# Patient Record
Sex: Female | Born: 1945 | Race: White | Hispanic: No | Marital: Single | Smoking: Former smoker
Health system: Southern US, Community
[De-identification: ages and names within clinical notes are randomized; demographics above are authoritative.]

## PROBLEM LIST (undated history)

## (undated) DIAGNOSIS — C801 Malignant (primary) neoplasm, unspecified: Secondary | ICD-10-CM

## (undated) DIAGNOSIS — E785 Hyperlipidemia, unspecified: Secondary | ICD-10-CM

## (undated) DIAGNOSIS — I10 Essential (primary) hypertension: Secondary | ICD-10-CM

## (undated) DIAGNOSIS — C3411 Malignant neoplasm of upper lobe, right bronchus or lung: Secondary | ICD-10-CM

## (undated) DIAGNOSIS — K219 Gastro-esophageal reflux disease without esophagitis: Secondary | ICD-10-CM

## (undated) DIAGNOSIS — R911 Solitary pulmonary nodule: Secondary | ICD-10-CM

## (undated) DIAGNOSIS — I77810 Thoracic aortic ectasia: Secondary | ICD-10-CM

## (undated) HISTORY — DX: Thoracic aortic ectasia: I77.810

## (undated) HISTORY — DX: Hyperlipidemia, unspecified: E78.5

## (undated) HISTORY — DX: Malignant neoplasm of upper lobe, right bronchus or lung: C34.11

## (undated) HISTORY — PX: EYE SURGERY: SHX253

## (undated) HISTORY — DX: Solitary pulmonary nodule: R91.1

## (undated) HISTORY — DX: Essential (primary) hypertension: I10

## (undated) HISTORY — DX: Malignant (primary) neoplasm, unspecified: C80.1

## (undated) HISTORY — PX: WRIST SURGERY: SHX841

## (undated) HISTORY — DX: Gastro-esophageal reflux disease without esophagitis: K21.9

---
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
## 1991-07-14 HISTORY — PX: VAGINAL HYSTERECTOMY: SUR661

## 2009-02-13 ENCOUNTER — Inpatient Hospital Stay (HOSPITAL_COMMUNITY): Admission: EM | Admit: 2009-02-13 | Discharge: 2009-02-20 | Payer: Self-pay | Admitting: Emergency Medicine

## 2009-12-19 ENCOUNTER — Emergency Department (HOSPITAL_COMMUNITY): Admission: EM | Admit: 2009-12-19 | Discharge: 2009-12-19 | Payer: Self-pay | Admitting: Emergency Medicine

## 2010-02-18 ENCOUNTER — Encounter: Admission: RE | Admit: 2010-02-18 | Discharge: 2010-02-18 | Payer: Self-pay | Admitting: Family Medicine

## 2010-10-18 LAB — COMPREHENSIVE METABOLIC PANEL
AST: 22 U/L (ref 0–37)
Albumin: 3.6 g/dL (ref 3.5–5.2)
Alkaline Phosphatase: 68 U/L (ref 39–117)
BUN: 9 mg/dL (ref 6–23)
CO2: 29 mEq/L (ref 19–32)
Calcium: 8.9 mg/dL (ref 8.4–10.5)
Creatinine, Ser: 0.87 mg/dL (ref 0.4–1.2)
GFR calc Af Amer: >60 mL/min (ref >60)
Glucose, Bld: 95 mg/dL (ref 70–99)
Potassium: 3.5 mEq/L (ref 3.5–5.1)

## 2010-10-18 LAB — CBC
HCT: 38.7 % (ref 36.0–46.0)
Hemoglobin: 13.4 g/dL (ref 12.0–15.0)
MCHC: 34.5 g/dL (ref 30.0–36.0)
MCV: 89 fL (ref 78.0–100.0)
Platelets: 216 10*3/uL (ref 150–400)
Platelets: 244 10*3/uL (ref 150–400)
RBC: 4.08 MIL/uL (ref 3.87–5.11)
RBC: 4.36 MIL/uL (ref 3.87–5.11)
RDW: 13.6 % (ref 11.5–15.5)
WBC: 10 10*3/uL (ref 4.0–10.5)
WBC: 3.3 10*3/uL — ABNORMAL LOW (ref 4.0–10.5)

## 2010-10-18 LAB — URINALYSIS, ROUTINE W REFLEX MICROSCOPIC
Hgb urine dipstick: NEGATIVE
Protein, ur: NEGATIVE mg/dL
Specific Gravity, Urine: 1.013 (ref 1.005–1.030)
pH: 6.5 (ref 5.0–8.0)

## 2010-10-18 LAB — BASIC METABOLIC PANEL
BUN: 4 mg/dL — ABNORMAL LOW (ref 6–23)
CO2: 27 mEq/L (ref 19–32)
Calcium: 8.6 mg/dL (ref 8.4–10.5)
Calcium: 9.4 mg/dL (ref 8.4–10.5)
Chloride: 95 mEq/L — ABNORMAL LOW (ref 96–112)
Creatinine, Ser: 0.74 mg/dL (ref 0.4–1.2)
Creatinine, Ser: 0.87 mg/dL (ref 0.4–1.2)
GFR calc Af Amer: >60 mL/min (ref >60)
Glucose, Bld: 144 mg/dL — ABNORMAL HIGH (ref 70–99)
Potassium: 4.1 mEq/L (ref 3.5–5.1)
Potassium: 4.1 mEq/L (ref 3.5–5.1)
Sodium: 132 mEq/L — ABNORMAL LOW (ref 135–145)
Sodium: 134 mEq/L — ABNORMAL LOW (ref 135–145)
Sodium: 135 mEq/L (ref 135–145)

## 2010-10-18 LAB — PHOSPHORUS: Phosphorus: 3.3 mg/dL (ref 2.3–4.6)

## 2010-10-18 LAB — URINE CULTURE: Culture: NO GROWTH

## 2010-10-18 LAB — MAGNESIUM: Magnesium: 1.8 mg/dL (ref 1.5–2.5)

## 2010-10-18 LAB — DIFFERENTIAL
Basophils Absolute: 0.1 10*3/uL (ref 0.0–0.1)
Lymphs Abs: 1.2 10*3/uL (ref 0.7–4.0)
Neutro Abs: 7.9 10*3/uL — ABNORMAL HIGH (ref 1.7–7.7)

## 2010-11-25 NOTE — H&P (Signed)
Mcneil Mcneil.:  [id number]   MEDICAL RECORD Mcneil.:  [id number]          PATIENT TYPE:  INP   LOCATION:  5118                         FACILITY:  [facility]   PHYSICIAN:  Lennie Muckle, MD      DATE OF BIRTH:  29-Oct-1945   DATE OF ADMISSION:  02/13/2009  DATE OF DISCHARGE:                              HISTORY & PHYSICAL   CHIEF COMPLAINT:  Abdominal pain.   HISTORY OF PRESENT ILLNESS:  The patient is a 65 year old female who  comes in complaining of abdominal pain.  The patient reports it started  approximately 2 days ago with gradual onset progressively worse pain  last evening with associated nausea and vomiting.  The patient reports  her belly gradually distended, and she was Mcneil longer able to pass gas or  have a bowel movement.  She comes in from the emergency department  seeking medical care.  She reports some chills and myalgias, but Mcneil true  fever.  She denies any chest pain, shortness of breath.  She denies  changes in her urination.  Mcneil dysuria, hematuria, nocturia.  She denies  any changes in mental status.  Denies any skin lesions or rashes.   PAST MEDICAL HISTORY:  Significant for hyperlipidemia.   PAST SURGICAL HISTORY:  Significant for hysterectomy and open  appendectomy in 1963.   FAMILY HISTORY:  Not pertinent in this case.   SOCIAL HISTORY:  The patient denies any tobacco or illicit drug use,  occasional alcoholic beverage.   DRUG ALLERGIES:  Mcneil known drug or latex allergies.   MEDICATIONS:  Consist of Pravachol.   PHYSICAL EXAMINATION:  VITAL SIGNS:  Stable.  Temperature 97.9, pulse is  78, respiratory rate 18, and blood pressure 122/82.  GENERAL APPEARANCE:  A 65 year old white female in Mcneil acute distress,  well-nourished, well-developed.  ENT:  Unremarkable, Mcneil lymphadenopathy.  NECK:  Supple.  CHEST:  Clear to auscultation.  Mcneil wheezes, rhonchi, or rales.  HEART:  Regular rate and rhythm.  Mcneil murmurs, gallops, or rubs.  ABDOMEN:  Moderately distended and protuberant.  She is kind of  diffusely tender predominately in the lower abdomen with high-pitched  bowel sounds audible.  EXTREMITIES:  Within normal limits with regards to full range of motion.  Deep tendon reflexes and 2+ peripheral pulses throughout.  SKIN:  Warm and dry with good turgor.  Mcneil rashes or skin lesions are  noted.  NEUROLOGIC:  The patient is alert and oriented, has a grossly intact  cranial nerves II through XII.   DIAGNOSTIC STUDIES:  A plain film abdomen and subsequent CT of the  abdomen shows significant changes of air-fluid levels with a possible  transition zone in the lower abdomen consistent with small bowel  obstruction, likely secondary to adhesions.   LABORATORY DATA:  The patient's white blood cell count is borderline  elevated at 10.0.  Otherwise, her electrolytes and renal function are  within normal limits and a urinalysis was otherwise negative.   ASSESSMENT OR ADMISSION DIAGNOSES:  1. Small bowel obstruction likely secondary to adhesions.  2. Hyperlipidemia.  PLAN:  Plan is to admit the patient for surgical intervention.  We  discussed the procedure of diagnostic laparoscopy possible exploratory  laparotomy with lysis of adhesions.  Again, procedure was discussed at  length including the risks, complications, postoperative expectations  with the patient and her son who are in agreement and will consent.  We  will give the patient a gram of Mefoxin on-call to the operating room  prior to procedure.      Brayton El, PA-C      Lennie Muckle, MD  Electronically Signed    KB/MEDQ  D:  02/13/2009  T:  02/13/2009  Job:  [id number]

## 2010-11-25 NOTE — Discharge Summary (Signed)
NAMEJOANELL, Tiffany Mcneil NO.:  [id number]   MEDICAL RECORD NO.:  [id number]          PATIENT TYPE:  INP   LOCATION:  5118                         FACILITY:  [facility]   PHYSICIAN:  Lennie Muckle, MD      DATE OF BIRTH:  [birth year]   DATE OF ADMISSION:  02/13/2009  DATE OF DISCHARGE:  02/20/2009                               DISCHARGE SUMMARY   DISCHARGING PHYSICIAN:  Wilmon Arms. Corliss Skains, MD   ADMITTING PHYSICIAN:  Amber L. Freida Busman, MD   HISTORY OF PRESENT ILLNESS:  The patient is a pleasant 65 year old  female who came to the emergency department complaining of abdominal  pain that progressed from several days prior to admission.  She also  developed associated nausea and vomiting and stated her belly became  more distended.  She subsequently was seen in the emergency department  without having any recent bowel movements or flatus.  She was seen in  the emergency department and worked up and subsequent CT found evidence  of a small bowel obstruction likely secondary to adhesions.  The  decision was made at that time to admit the patient for IV fluid  hydration and probable surgical intervention.   SUMMARY OF THE HOSPITAL COURSE:  The patient was admitted on February 13, 2009, was taken to the operating room that same day by Dr. Freida Busman and  underwent diagnostic laparoscopy with lysis of adhesions and release of  small bowel obstruction.  The patient suffered no immediate  complications during the procedure and was admitted to the floor in a  stable condition.  Postoperatively; however, the patient had a  significant postoperative ileus that has lasted numerous days.  Her labs  have been normal with most recent white blood cell count of 3.3 and  electrolytes being normal as well.  The patient had shown signs of  progression with small amounts of flatus being passed.  However,  initiation of liquid diet resulted in recurrent nausea and vomiting.  The patient was subsequently  started on Reglan IV q.6 h. on February 19, 2009.  Today, on August 11, the patient feels significantly better, has  not had any further nausea or vomiting since the previous day and has  continued to pass flatus and bowel movement.  She is tolerating full  liquid and soft diet and feels appropriate to be discharged home.  No  other complications or pertinent information is available at this time.  The patient is stable for discharge.   DISCHARGE DIAGNOSES:  1. Small bowel obstruction likely secondary to lysis of adhesions.  2. Postop ileus - resolving.  3. Hyperlipidemia.   DISCHARGE PLAN:  The patient will be discharge with instructions  regarding diet restrictions and activity restrictions.  The patient is  advised to continue going slowly with a bland, soft, or liquid diet  until consistent bowel activity is established, then a normal diet can  be resumed.  She will be given prescription for Reglan 10 mg to be taken  3 times daily for the next 5 days as an outpatient to continue with  her  resolving ileus, but if she feels that she no longer needs this over the  next 24-48 hours, she is okay to discontinue it.  She will also be given  prescription for  narcotic analgesic as to be used as needed, no more than 4 tablets  today.  We will have her follow up in the office to see Dr. Freida Busman in  approximately 1-2 weeks as an outpatient.  She is instructed to call our  office any sooner if issue should arise.      Brayton El, PA-C      Lennie Muckle, MD  Electronically Signed    KB/MEDQ  D:  02/20/2009  T:  02/21/2009  Job:  [id number]

## 2010-11-25 NOTE — Op Note (Signed)
NAMESAMEERA, BETTON NO.:  [id number]   MEDICAL RECORD NO.:  [id number]          PATIENT TYPE:  INP   LOCATION:  5118                         FACILITY:  [facility]   PHYSICIAN:  Lennie Muckle, MD      DATE OF BIRTH:  1945/10/13   DATE OF PROCEDURE:  02/13/2009  DATE OF DISCHARGE:                               OPERATIVE REPORT   PREOPERATIVE DIAGNOSIS:  Small bowel obstruction likely from adhesions.   POSTOPERATIVE DIAGNOSIS:  Small bowel obstruction likely from adhesions.   PROCEDURE:  Diagnostic laparoscopy with lysis of adhesions.   FINDINGS:  Adhesive band in the left lower quadrant causing obstruction  in small intestine.   SURGEON:  Amber L. Freida Busman, MD   ASSISTANT:  OR staff.   ANESTHESIA:  General endotracheal anesthesia.   COMPLICATIONS:  No immediate complications.   DRAINS:  No drains were placed.   ESTIMATED BLOOD LOSS:  Minimal amount of blood loss.   INDICATIONS FOR PROCEDURE:  Ms. Stanko is a 65 year old female who began  having abdominal pain in the couple of days ago.  The pain was crescendo-  decrescendo.  The pain intensified last night.  She began to having  nausea and vomiting.  Passed no flatus for 2 days.  She came in the  emergency department, had a CT scan, which revealed an obstruction with  a likely adhesion in the lower abdomen.  She had also had a large amount  of free fluid within her abdomen.  Examination was concerning due to  abdominal pain.  I talked to Ms. Mcpartland preoperatively about performing  diagnostic laparoscopy.  I did discuss possibility of an open procedure  and a bowel resection if indeed this was required.  All questions were  answered.  Informed consent was obtained.  She had received cefoxitin  preoperatively.   DETAILS OF PROCEDURE:  Ms. Khamis was identified in the preoperative  holding area.  She was then taken to the operating room and placed in  supine position.  After administration of general  endotracheal  anesthesia, SCDs were applied to the lower extremity.  A Foley catheter  was placed.  Her abdomen was prepped and draped in the usual sterile  fashion.  A time-out procedure indicating the patient and procedure were  performed.  I placed an incision just below the umbilicus through the  old incisional scar.  Identified the fascia.  This was incised with a  #15 blade.  I then identified the peritoneum, incised with Metzenbaum  scissors, easily gained entry into the abdominal cavity.  Finger  palpation confirmed entry into abdominal cavity.  There were no evidence  of adhesions to the abdominal wall.  I placed a Hasson trocar into the  abdominal cavity.  I insufflated the abdomen.  I inspected the abdomen  and found no evidence of injury upon placement of trocar and no  adhesions to the abdominal wall.  I placed 5-mm trocar in the right  upper quadrant under visualization of the camera.  She did have a large  amount of fluid within the abdomen.  I then placed a 5-mm trocar in the  left lower quadrant.  I began by identifying the cecum.  The ileum was  identified.  There were few adhesion of scars in this area, which I was  able to gently do with blunt dissection as well as the laparoscopic  scissors.  I continued inferiorly down to the pelvis.  There were few  more adhesions, which are easily dissected with the scissors.  There was  a loop which was adhered down in the pelvis.  I began mobilizing the  intestine and found this indeed adhered to the abdominal wall on the  left.  There was a dense adhesion of scar, which was successfully  divided with the laparoscopic scissors.  I then was able to freely  remove the intestine from the cecum to the ligament of Treitz.  This  appeared to be the one area of concern on the left lower quadrant.  I  irrigated the abdomen with a liter of saline, found no other evidence of  adhesions and no injury to the intestine, then closed the fascial  defect  at the umbilical area using a 0 Vicryl suture.  Skin was removed after  releasing pneumoperitoneum, skin was closed with 4-0 Monocryl.  Dermabond was placed for final dressing.  The patient was then extubated  and transported to the postanesthesia care unit in stable condition.  She will be monitored for the next couple of days when she was able to  tolerate a diet and pain is controlled.      Lennie Muckle, MD  Electronically Signed     ALA/MEDQ  D:  02/13/2009  T:  02/13/2009  Job:  [id number]   cc:   [facility]

## 2011-08-11 ENCOUNTER — Other Ambulatory Visit: Payer: Self-pay | Admitting: Interventional Cardiology

## 2011-08-12 ENCOUNTER — Inpatient Hospital Stay (HOSPITAL_BASED_OUTPATIENT_CLINIC_OR_DEPARTMENT_OTHER)
Admission: RE | Admit: 2011-08-12 | Discharge: 2011-08-12 | Disposition: A | Discharge status: Home / Self Care | Payer: Medicare Other | Source: Ambulatory Visit | Attending: Interventional Cardiology | Admitting: Interventional Cardiology

## 2011-08-12 ENCOUNTER — Encounter (HOSPITAL_BASED_OUTPATIENT_CLINIC_OR_DEPARTMENT_OTHER)
Admission: RE | Disposition: A | Discharge status: Home / Self Care | Payer: Self-pay | Source: Ambulatory Visit | Attending: Interventional Cardiology

## 2011-08-12 DIAGNOSIS — I77819 Aortic ectasia, unspecified site: Secondary | ICD-10-CM | POA: Insufficient documentation

## 2011-08-12 SURGERY — JV LEFT HEART CATHETERIZATION WITH CORONARY ANGIOGRAM
Anesthesia: Moderate Sedation

## 2011-08-12 MED ORDER — DIAZEPAM 5 MG PO TABS
5.0000 mg | ORAL_TABLET | ORAL | Status: AC
  Administered 2011-08-12: 5 mg via ORAL

## 2011-08-12 MED ORDER — MORPHINE SULFATE 2 MG/ML IJ SOLN: 1.0000 mg | INTRAMUSCULAR | Status: DC | PRN

## 2011-08-12 MED ORDER — ONDANSETRON HCL 4 MG/2ML IJ SOLN: 4.0000 mg | Freq: Four times a day (QID) | INTRAMUSCULAR | Status: DC | PRN

## 2011-08-12 MED ORDER — SODIUM CHLORIDE 0.9 % IV SOLN: INTRAVENOUS | Status: AC

## 2011-08-12 MED ORDER — ASPIRIN 81 MG PO CHEW: 81.0000 mg | CHEWABLE_TABLET | Freq: Every day | ORAL | Status: DC

## 2011-08-12 MED ORDER — ACETAMINOPHEN 325 MG PO TABS: 650.0000 mg | ORAL_TABLET | ORAL | Status: DC | PRN

## 2011-08-12 MED ORDER — SODIUM CHLORIDE 0.9 % IV SOLN: 250.0000 mL | INTRAVENOUS | Status: DC | PRN

## 2011-08-12 MED ORDER — SODIUM CHLORIDE 0.9 % IJ SOLN: 3.0000 mL | Freq: Two times a day (BID) | INTRAMUSCULAR | Status: DC

## 2011-08-12 MED ORDER — SODIUM CHLORIDE 0.9 % IJ SOLN: 3.0000 mL | INTRAMUSCULAR | Status: DC | PRN

## 2011-08-12 MED ORDER — SODIUM CHLORIDE 0.9 % IV SOLN: INTRAVENOUS | Status: DC

## 2011-08-12 MED ORDER — ASPIRIN 81 MG PO CHEW
324.0000 mg | CHEWABLE_TABLET | ORAL | Status: AC
  Administered 2011-08-12: 324 mg via ORAL

## 2011-08-12 NOTE — OR Nursing (Signed)
Tegaderm dressing applied, site level 0, bedrest begins at 1205 

## 2011-08-12 NOTE — H&P (Signed)
  Date of Initial H&P: 08/07/2011  History reviewed, patient examined, no change in status, stable for surgery.   Tiffany Mcneil S. 08/12/2011

## 2011-08-12 NOTE — OR Nursing (Signed)
Dr Varanasi at bedside to discuss results and treatment plan with pt and family 

## 2011-08-12 NOTE — OR Nursing (Signed)
Discharge instructions reviewed and signed, pt stated understanding, ambulated in hall without difficulty, site level 0, transported to son's car via wheelchair 

## 2011-08-12 NOTE — Op Note (Signed)
PROCEDURE:  Left heart catheterization with selective coronary angiography, left ventriculogram.  INDICATIONS:    The risks, benefits, and details of the procedure were explained to the patient.  The patient verbalized understanding and wanted to proceed.  Informed written consent was obtained.  PROCEDURE TECHNIQUE:  After Xylocaine anesthesia a 64F sheath was placed in the right femoral artery with a single anterior needle wall stick.   Left coronary angiography was done using a Judkins L6 guide catheter.  Right coronary angiography was done using an AL2 guide catheter.  Left ventriculography was done using a pigtail catheter.    CONTRAST:  Total of 110 cc.  COMPLICATIONS:  None.    HEMODYNAMICS:  Aortic pressure was 154/84; LV pressure was 153 [place] and; LVEDP 18.  There was no gradient between the left ventricle and aorta.    ANGIOGRAPHIC DATA:   The left main coronary artery is widely patent.  The left anterior descending artery is a large vessel which reaches the apex.  It appears angiographically normal..  The left circumflex artery is a large codominant vessel.  There is a ramus intermedius vessel which appears patent.  This is a small vessel.  There is a small OM1 which is widely patent.  There is a large OM 2 which is widely patent.  There is a small left PDA which is patent..  The right coronary artery is is a large codominant vessel.  The PDA and posterior lateral artery are small but widely patent.  The right coronary system is widely patent.  LEFT VENTRICULOGRAM:  Left ventricular angiogram was done in the 30 RAO projection and revealed normal left ventricular wall motion and systolic function with an estimated ejection fraction of 60 %.  LVEDP was 18 mmHg.  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  This required use of special catheters to engage the coronaries.  IMPRESSIONS:  1. Normal left main coronary artery. 2. Normal left anterior descending artery and its  branches. 3. Normal left circumflex artery and its branches. 4. Normal right coronary artery. 5. Normal left ventricular systolic function.  LVEDP 18 mmHg.  Ejection fraction 60 %. 6.  Dilated aortic root and ascending aorta.  RECOMMENDATION:  Continue preventative therapy.  She will need further imaging of her ascending aorta.  Will schedule echocardiogram.

## 2011-08-12 NOTE — OR Nursing (Signed)
Meal served 

## 2011-08-14 ENCOUNTER — Other Ambulatory Visit: Payer: Self-pay | Admitting: Interventional Cardiology

## 2011-08-14 DIAGNOSIS — R0789 Other chest pain: Secondary | ICD-10-CM

## 2011-09-29 ENCOUNTER — Other Ambulatory Visit: Payer: Self-pay | Admitting: Family Medicine

## 2011-09-29 ENCOUNTER — Ambulatory Visit
Admission: RE | Admit: 2011-09-29 | Discharge: 2011-09-29 | Disposition: A | Discharge status: Home / Self Care | Payer: Medicare Other | Source: Ambulatory Visit | Attending: Family Medicine | Admitting: Family Medicine

## 2011-09-29 ENCOUNTER — Other Ambulatory Visit (HOSPITAL_COMMUNITY): Payer: Self-pay | Admitting: Family Medicine

## 2011-09-29 DIAGNOSIS — R911 Solitary pulmonary nodule: Secondary | ICD-10-CM

## 2011-09-29 DIAGNOSIS — R9389 Abnormal findings on diagnostic imaging of other specified body structures: Secondary | ICD-10-CM

## 2011-09-29 MED ORDER — IOHEXOL 300 MG/ML  SOLN
75.0000 mL | Freq: Once | INTRAMUSCULAR | Status: AC | PRN
  Administered 2011-09-29: 75 mL via INTRAVENOUS

## 2011-10-07 ENCOUNTER — Inpatient Hospital Stay (HOSPITAL_COMMUNITY): Admission: RE | Admit: 2011-10-07 | Payer: Medicare Other | Source: Ambulatory Visit

## 2011-10-08 DIAGNOSIS — D179 Benign lipomatous neoplasm, unspecified: Secondary | ICD-10-CM | POA: Insufficient documentation

## 2011-10-08 DIAGNOSIS — M810 Age-related osteoporosis without current pathological fracture: Secondary | ICD-10-CM | POA: Insufficient documentation

## 2011-10-08 DIAGNOSIS — M7050 Other bursitis of knee, unspecified knee: Secondary | ICD-10-CM | POA: Insufficient documentation

## 2011-10-08 DIAGNOSIS — I10 Essential (primary) hypertension: Secondary | ICD-10-CM | POA: Insufficient documentation

## 2011-10-08 DIAGNOSIS — C3411 Malignant neoplasm of upper lobe, right bronchus or lung: Secondary | ICD-10-CM | POA: Insufficient documentation

## 2011-10-08 DIAGNOSIS — K219 Gastro-esophageal reflux disease without esophagitis: Secondary | ICD-10-CM | POA: Insufficient documentation

## 2011-10-08 DIAGNOSIS — J309 Allergic rhinitis, unspecified: Secondary | ICD-10-CM | POA: Insufficient documentation

## 2011-10-08 DIAGNOSIS — E785 Hyperlipidemia, unspecified: Secondary | ICD-10-CM | POA: Insufficient documentation

## 2011-10-09 ENCOUNTER — Encounter (HOSPITAL_COMMUNITY)
Admission: RE | Admit: 2011-10-09 | Discharge: 2011-10-09 | Disposition: A | Discharge status: Home / Self Care | Payer: Medicare Other | Source: Ambulatory Visit | Attending: Family Medicine | Admitting: Family Medicine

## 2011-10-09 ENCOUNTER — Encounter (HOSPITAL_COMMUNITY): Payer: Self-pay

## 2011-10-09 DIAGNOSIS — R911 Solitary pulmonary nodule: Secondary | ICD-10-CM | POA: Insufficient documentation

## 2011-10-09 LAB — GLUCOSE, CAPILLARY: Glucose-Capillary: 125 mg/dL — ABNORMAL HIGH (ref 70–99)

## 2011-10-09 MED ORDER — FLUDEOXYGLUCOSE F - 18 (FDG) INJECTION
15.5000 | Freq: Once | INTRAVENOUS | Status: AC | PRN
  Administered 2011-10-09: 15.5 via INTRAVENOUS

## 2011-10-14 ENCOUNTER — Telehealth: Payer: Self-pay | Admitting: *Deleted

## 2011-10-14 NOTE — Telephone Encounter (Signed)
Left message regarding appt time and place 

## 2011-10-15 ENCOUNTER — Institutional Professional Consult (permissible substitution) (INDEPENDENT_AMBULATORY_CARE_PROVIDER_SITE_OTHER): Payer: Medicare Other | Admitting: Thoracic Surgery

## 2011-10-15 ENCOUNTER — Other Ambulatory Visit: Payer: Self-pay | Admitting: Thoracic Surgery

## 2011-10-15 ENCOUNTER — Other Ambulatory Visit: Payer: Self-pay

## 2011-10-15 VITALS — BP 143/87 | HR 91 | Temp 97.4°F | Resp 18 | Ht 68.0 in | Wt 178.0 lb

## 2011-10-15 DIAGNOSIS — R222 Localized swelling, mass and lump, trunk: Secondary | ICD-10-CM

## 2011-10-15 DIAGNOSIS — R918 Other nonspecific abnormal finding of lung field: Secondary | ICD-10-CM

## 2011-10-15 DIAGNOSIS — D381 Neoplasm of uncertain behavior of trachea, bronchus and lung: Secondary | ICD-10-CM

## 2011-10-15 NOTE — Progress Notes (Signed)
HPI this 66 year old Tiffany Mcneil is a former smoker. She had a chest x-ray in 2011 and had a recent chest x-ray that shows a new right upper lobe 12 mm lesion this is confirmed on CT scan. PET scan showed a standard uptake value of 1.3. Her previous surgery includes appendectomy hysterectomy and low back surgery. She smoked 25 years and quit smoking 20 years ago. He is a moderate use of alcohol he also gives a history of colonic polyps as well as hypertension and hypercholesterolemia. She's had no hemoptysis fever chills or excessive sputum. Since this is a new nodule in the right upper lobe resection was recommended. Tiffany Mcneil agrees to the surgery and understands the risk and benefits.   Current Outpatient Prescriptions  Medication Sig Dispense Refill  . alendronate (FOSAMAX) 70 MG tablet Take 70 mg by mouth every 7 (seven) days. Take with a full glass of water on an empty stomach.      Marland Kitchen amLODipine (NORVASC) 5 MG tablet Take 5 mg by mouth daily.      Marland Kitchen atorvastatin (LIPITOR) 20 MG tablet Take 20 mg by mouth daily.      . clobetasol cream (TEMOVATE) 0.05 % Apply topically 2 (two) times daily.      Marland Kitchen esomeprazole (NEXIUM) 40 MG capsule Take 40 mg by mouth daily before breakfast.      . fish oil-omega-3 fatty acids 1000 MG capsule Take 2 g by mouth daily.      . hydrochlorothiazide (MICROZIDE) 12.5 MG capsule Take 12.5 mg by mouth daily.      Marland Kitchen HYDROcodone-homatropine (HYCODAN) 5-1.5 MG/5ML syrup Take 5 mLs by mouth every 6 (six) hours as needed.      . mometasone (NASONEX) 50 MCG/ACT nasal spray Place 2 sprays into the nose daily.      . Multiple Vitamins-Minerals (MULTIVITAL) tablet Take 1 tablet by mouth daily.      . benzonatate (TESSALON) 200 MG capsule Take 200 mg by mouth 3 (three) times daily as needed.      . nitroGLYCERIN (NITROSTAT) 0.4 MG SL tablet Place 0.4 mg under the tongue every 5 (five) minutes as needed.         Review of Systems: Her weight side and 70 pounds she is 5 foot 8 inch  hemorrhoids been stable. Cardiac no recent angina or atrial fibrillation. Pulmonary she has a cough that was thought to be secondary to lisinopril which is been stopped. GI she has reflux from previous appendectomy GU no kidney disease this year or frequent urination. Vascular no claudication DVT or TIAs. Neurology no dizziness headaches like as seizures. Musculoskeletal joint pain psychiatric no depression or nervousness. ENT no change in her Ieyesight or her hearing   Physical Exam  Constitutional: She is oriented to person, place, and time. She appears well-developed and well-nourished.  HENT:  Head: Atraumatic.  Right Ear: External ear normal.  Left Ear: External ear normal.  Mouth/Throat: Oropharynx is clear and moist.  Eyes: Conjunctivae and EOM are normal. Pupils are equal, round, and reactive to light.  Neck: Normal range of motion. Neck supple.  Cardiovascular: Normal rate and normal heart sounds.   Pulmonary/Chest: Effort normal and breath sounds normal. No respiratory distress.  Abdominal: Soft. Bowel sounds are normal.  Musculoskeletal: Normal range of motion. She exhibits no edema and no tenderness.  Lymphadenopathy:    She has no cervical adenopathy.  Neurological: She is alert and oriented to person, place, and time. She has normal reflexes. No cranial nerve deficit.  Coordination normal.  Skin: Skin is warm and dry.  Psychiatric: She has a normal mood and affect. Her behavior is normal. Judgment and thought content normal.     Diagnostic Tests: CT scan shows a 12 mm nodule in the right upper lobe with an SUV on PET 1.3 there also is a 10 x 7 mm nodule in the left upper lobe   Impression: Bilateral pulmonary nodules rule out adenocarcinoma in situ   Plan: Resection right upper lobe nodule

## 2011-10-16 ENCOUNTER — Encounter (HOSPITAL_COMMUNITY)
Admission: RE | Admit: 2011-10-16 | Discharge: 2011-10-16 | Disposition: A | Discharge status: Home / Self Care | Payer: Medicare Other | Source: Ambulatory Visit | Attending: Thoracic Surgery | Admitting: Thoracic Surgery

## 2011-10-16 ENCOUNTER — Other Ambulatory Visit: Payer: Self-pay

## 2011-10-16 ENCOUNTER — Inpatient Hospital Stay (HOSPITAL_COMMUNITY)
Admission: RE | Admit: 2011-10-16 | Discharge: 2011-10-16 | Disposition: A | Discharge status: Home / Self Care | Payer: Medicare Other | Source: Ambulatory Visit | Attending: Thoracic Surgery | Admitting: Thoracic Surgery

## 2011-10-16 ENCOUNTER — Encounter (HOSPITAL_COMMUNITY): Payer: Self-pay

## 2011-10-16 ENCOUNTER — Encounter: Payer: Self-pay | Admitting: *Deleted

## 2011-10-16 VITALS — BP 126/79 | HR 82 | Temp 96.6°F | Resp 20 | Ht 68.0 in | Wt 175.9 lb

## 2011-10-16 DIAGNOSIS — D381 Neoplasm of uncertain behavior of trachea, bronchus and lung: Secondary | ICD-10-CM

## 2011-10-16 LAB — URINALYSIS, ROUTINE W REFLEX MICROSCOPIC
Bilirubin Urine: NEGATIVE
Hgb urine dipstick: NEGATIVE
Ketones, ur: NEGATIVE mg/dL
Protein, ur: NEGATIVE mg/dL
Urobilinogen, UA: 0.2 mg/dL (ref 0.0–1.0)

## 2011-10-16 LAB — CBC
HCT: 38.7 % (ref 36.0–46.0)
MCHC: 34.6 g/dL (ref 30.0–36.0)
Platelets: 282 10*3/uL (ref 150–400)
RDW: 13.6 % (ref 11.5–15.5)

## 2011-10-16 LAB — BLOOD GAS, ARTERIAL
Drawn by: 344381
FIO2: 0.21 %
Patient temperature: 98.6
pH, Arterial: 7.527 — ABNORMAL HIGH (ref 7.350–7.400)

## 2011-10-16 LAB — SURGICAL PCR SCREEN
MRSA, PCR: NEGATIVE
Staphylococcus aureus: NEGATIVE

## 2011-10-16 LAB — COMPREHENSIVE METABOLIC PANEL
Albumin: 3.9 g/dL (ref 3.5–5.2)
Alkaline Phosphatase: 78 U/L (ref 39–117)
BUN: 14 mg/dL (ref 6–23)
Potassium: 3 mEq/L — ABNORMAL LOW (ref 3.5–5.1)
Sodium: 134 mEq/L — ABNORMAL LOW (ref 135–145)
Total Protein: 6.9 g/dL (ref 6.0–8.3)

## 2011-10-16 LAB — APTT: aPTT: 28 seconds (ref 24–37)

## 2011-10-16 LAB — ABO/RH: ABO/RH(D): A NEG

## 2011-10-16 LAB — PROTIME-INR: Prothrombin Time: 12.5 seconds (ref 11.6–15.2)

## 2011-10-16 LAB — PULMONARY FUNCTION TEST

## 2011-10-16 NOTE — Pre-Procedure Instructions (Signed)
20 Tiffany Mcneil  10/16/2011   Your procedure is scheduled on:  April 10,2013  Report to [facility] at 5:30 AM.  Call this number if you have problems the morning of surgery: [phone]   Remember:   Do not eat food:After Midnight.  May have clear liquids: up to 4 Hours before arrival.  Clear liquids include soda, tea, black coffee, apple or grape juice, broth.  Take these medicines the morning of surgery with A SIP OF WATER: NORVASC,NEXIUM,HYCODAN AS NEEDED   Do not wear jewelry, make-up or nail polish.  Do not wear lotions, powders, or perfumes. You may wear deodorant.  Do not shave 48 hours prior to surgery.  Do not bring valuables to the hospital.  Contacts, dentures or bridgework may not be worn into surgery.  Leave suitcase in the car. After surgery it may be brought to your room.  For patients admitted to the hospital, checkout time is 11:00 AM the day of discharge.   Patients discharged the day of surgery will not be allowed to drive home.  Name and phone number of your driver:   Special Instructions: CHG Shower Use Special Wash: 1/2 bottle night before surgery and 1/2 bottle morning of surgery.   Please read over the following fact sheets that you were given: Pain Booklet, Blood Transfusion Information, MRSA Information and Surgical Site Infection Prevention

## 2011-10-16 NOTE — Progress Notes (Signed)
Spoke with pt at [facility] 10/15/11.  Educational/resource information given to pt.  Questions and concerns answered.

## 2011-10-19 ENCOUNTER — Encounter (HOSPITAL_COMMUNITY): Payer: Medicare Other

## 2011-10-19 ENCOUNTER — Encounter (HOSPITAL_COMMUNITY): Payer: Self-pay | Admitting: Vascular Surgery

## 2011-10-19 ENCOUNTER — Other Ambulatory Visit (HOSPITAL_COMMUNITY): Payer: Medicare Other

## 2011-10-19 ENCOUNTER — Encounter (HOSPITAL_COMMUNITY): Payer: Self-pay | Admitting: Pharmacy Technician

## 2011-10-19 NOTE — Consult Note (Signed)
Anesthesia: Patient is a 66 year old scheduled for a right VATS, possible RU lobectomy on 10/22/11.  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  Her EKG from 10/16/11 showed NSR, nonspecific ST/T wave abnormality.    She was referred to Dr. Eldridge Dace for Cardiology evaluation for chest pain on 08/07/11.  It appears she began having chest pain during her treadmill stress test, so a decision was made to proceed with cardiac catheterization which was done on 08/12/11 and showed:  1. Normal left main coronary artery. 2. Normal left anterior descending artery and its branches. 3. Normal left circumflex artery and its branches. 4. Normal right coronary artery. 5. Normal left ventricular systolic function. LVEDP 18 mmHg. Ejection fraction 60 %.       6. Dilated aortic root and ascending aorta.    An echo was subsequently done which showed LVEF 60-65%, mild LAE, trace AR, moderate ascending aorta dilation up to 4.3 cm.  MRA of the ascending aorta in 6 months was recommended.  Chest CT with contrast on 09/29/11 showed: 1. The nodular area questioned on chest x-ray represents an 9 x 12 x 14 mm irregularly marginated noncalcified lesion in the right upper lobe. Neoplasm cannot be excluded and PET CT is recommended.  2. No mediastinal or hilar adenopathy.  Labs noted.  She is for a CXR on the day of surgery.  Anticipate she can proceed as planned.

## 2011-10-20 ENCOUNTER — Other Ambulatory Visit (HOSPITAL_COMMUNITY): Payer: Medicare Other

## 2011-10-21 ENCOUNTER — Encounter: Payer: Medicare Other | Admitting: Cardiothoracic Surgery

## 2011-10-21 MED ORDER — DEXTROSE 5 % IV SOLN
1.5000 g | INTRAVENOUS | Status: AC
  Administered 2011-10-22: 1.5 g via INTRAVENOUS
  Filled 2011-10-21: qty 1.5

## 2011-10-22 ENCOUNTER — Encounter (HOSPITAL_COMMUNITY): Payer: Self-pay | Admitting: Vascular Surgery

## 2011-10-22 ENCOUNTER — Inpatient Hospital Stay (HOSPITAL_COMMUNITY)
Admission: RE | Admit: 2011-10-22 | Discharge: 2011-10-26 | DRG: 165 | Disposition: A | Discharge status: Home / Self Care | Payer: Medicare Other | Source: Ambulatory Visit | Attending: Thoracic Surgery | Admitting: Thoracic Surgery

## 2011-10-22 ENCOUNTER — Ambulatory Visit (HOSPITAL_COMMUNITY): Payer: Medicare Other

## 2011-10-22 ENCOUNTER — Ambulatory Visit (HOSPITAL_COMMUNITY): Payer: Medicare Other | Admitting: Vascular Surgery

## 2011-10-22 ENCOUNTER — Encounter (HOSPITAL_COMMUNITY)
Admission: RE | Disposition: A | Discharge status: Home / Self Care | Payer: Self-pay | Source: Ambulatory Visit | Attending: Thoracic Surgery

## 2011-10-22 ENCOUNTER — Encounter (HOSPITAL_COMMUNITY): Payer: Self-pay | Admitting: *Deleted

## 2011-10-22 DIAGNOSIS — M810 Age-related osteoporosis without current pathological fracture: Secondary | ICD-10-CM | POA: Diagnosis present

## 2011-10-22 DIAGNOSIS — K219 Gastro-esophageal reflux disease without esophagitis: Secondary | ICD-10-CM | POA: Diagnosis present

## 2011-10-22 DIAGNOSIS — D381 Neoplasm of uncertain behavior of trachea, bronchus and lung: Secondary | ICD-10-CM

## 2011-10-22 DIAGNOSIS — C3410 Malignant neoplasm of upper lobe, unspecified bronchus or lung: Principal | ICD-10-CM | POA: Diagnosis present

## 2011-10-22 DIAGNOSIS — Z87891 Personal history of nicotine dependence: Secondary | ICD-10-CM

## 2011-10-22 DIAGNOSIS — I10 Essential (primary) hypertension: Secondary | ICD-10-CM | POA: Diagnosis present

## 2011-10-22 DIAGNOSIS — Z01812 Encounter for preprocedural laboratory examination: Secondary | ICD-10-CM

## 2011-10-22 DIAGNOSIS — Z01810 Encounter for preprocedural cardiovascular examination: Secondary | ICD-10-CM

## 2011-10-22 DIAGNOSIS — Z01818 Encounter for other preprocedural examination: Secondary | ICD-10-CM

## 2011-10-22 HISTORY — PX: LUNG SURGERY: SHX703

## 2011-10-22 SURGERY — VIDEO ASSISTED THORACOSCOPY (VATS)/ LOBECTOMY
Anesthesia: General | Site: Chest | Laterality: Right | Wound class: Clean Contaminated

## 2011-10-22 MED ORDER — ONDANSETRON HCL 4 MG/2ML IJ SOLN
4.0000 mg | Freq: Four times a day (QID) | INTRAMUSCULAR | Status: DC | PRN
  Administered 2011-10-22 – 2011-10-25 (×2): 4 mg via INTRAVENOUS
  Filled 2011-10-22 (×2): qty 2

## 2011-10-22 MED ORDER — FENTANYL 10 MCG/ML IV SOLN
INTRAVENOUS | Status: DC
  Administered 2011-10-22: 12:00:00 via INTRAVENOUS
  Administered 2011-10-22: 10 ug via INTRAVENOUS
  Administered 2011-10-23: 60 ug via INTRAVENOUS
  Administered 2011-10-23 (×2): 110 ug via INTRAVENOUS
  Administered 2011-10-23: 20 ug via INTRAVENOUS
  Administered 2011-10-23: 40 ug via INTRAVENOUS
  Administered 2011-10-23: 60 ug via INTRAVENOUS
  Administered 2011-10-24: 03:00:00 via INTRAVENOUS
  Administered 2011-10-24: 10 ug via INTRAVENOUS
  Administered 2011-10-24: 70 ug via INTRAVENOUS
  Administered 2011-10-24: 20 ug via INTRAVENOUS
  Filled 2011-10-22 (×2): qty 50

## 2011-10-22 MED ORDER — ALBUTEROL SULFATE HFA 108 (90 BASE) MCG/ACT IN AERS
4.0000 | INHALATION_SPRAY | Freq: Four times a day (QID) | RESPIRATORY_TRACT | Status: AC
  Administered 2011-10-22 – 2011-10-24 (×6): 4 via RESPIRATORY_TRACT
  Filled 2011-10-22: qty 6.7

## 2011-10-22 MED ORDER — OMEGA-3-ACID ETHYL ESTERS 1 G PO CAPS
1.0000 g | ORAL_CAPSULE | Freq: Every day | ORAL | Status: DC
  Administered 2011-10-22: 1 g via ORAL
  Filled 2011-10-22 (×5): qty 1

## 2011-10-22 MED ORDER — MIDAZOLAM HCL 5 MG/5ML IJ SOLN
INTRAMUSCULAR | Status: DC | PRN
  Administered 2011-10-22 (×2): 1 mg via INTRAVENOUS

## 2011-10-22 MED ORDER — ACETAMINOPHEN 10 MG/ML IV SOLN
INTRAVENOUS | Status: AC
  Filled 2011-10-22: qty 100

## 2011-10-22 MED ORDER — NALOXONE HCL 0.4 MG/ML IJ SOLN: 0.4000 mg | INTRAMUSCULAR | Status: DC | PRN

## 2011-10-22 MED ORDER — NEOSTIGMINE METHYLSULFATE 1 MG/ML IJ SOLN
INTRAMUSCULAR | Status: DC | PRN
  Administered 2011-10-22: 4 mg via INTRAVENOUS

## 2011-10-22 MED ORDER — ACETAMINOPHEN 10 MG/ML IV SOLN
INTRAVENOUS | Status: DC | PRN
  Administered 2011-10-22: 1000 mg via INTRAVENOUS

## 2011-10-22 MED ORDER — SODIUM CHLORIDE 0.9 % IJ SOLN: 9.0000 mL | INTRAMUSCULAR | Status: DC | PRN

## 2011-10-22 MED ORDER — OMEGA-3 FATTY ACIDS 1000 MG PO CAPS: 1.0000 g | ORAL_CAPSULE | Freq: Every day | ORAL | Status: DC

## 2011-10-22 MED ORDER — DIPHENHYDRAMINE HCL 12.5 MG/5ML PO ELIX
12.5000 mg | ORAL_SOLUTION | Freq: Four times a day (QID) | ORAL | Status: DC | PRN
  Filled 2011-10-22: qty 5

## 2011-10-22 MED ORDER — DIPHENHYDRAMINE HCL 50 MG/ML IJ SOLN: 12.5000 mg | Freq: Four times a day (QID) | INTRAMUSCULAR | Status: DC | PRN

## 2011-10-22 MED ORDER — ONDANSETRON HCL 4 MG/2ML IJ SOLN
INTRAMUSCULAR | Status: DC | PRN
  Administered 2011-10-22: 4 mg via INTRAVENOUS

## 2011-10-22 MED ORDER — BUPIVACAINE 0.5 % ON-Q PUMP SINGLE CATH 400 ML
400.0000 mL | INJECTION | Status: DC
  Filled 2011-10-22: qty 400

## 2011-10-22 MED ORDER — FENTANYL CITRATE 0.05 MG/ML IJ SOLN: 50.0000 ug | INTRAMUSCULAR | Status: DC | PRN

## 2011-10-22 MED ORDER — ROCURONIUM BROMIDE 100 MG/10ML IV SOLN
INTRAVENOUS | Status: DC | PRN
  Administered 2011-10-22: 50 mg via INTRAVENOUS

## 2011-10-22 MED ORDER — BUPIVACAINE 0.5 % ON-Q PUMP SINGLE CATH 400 ML: INJECTION | Status: DC | PRN

## 2011-10-22 MED ORDER — SENNOSIDES-DOCUSATE SODIUM 8.6-50 MG PO TABS
1.0000 | ORAL_TABLET | Freq: Every evening | ORAL | Status: DC | PRN
  Administered 2011-10-25: 1 via ORAL
  Filled 2011-10-22: qty 1

## 2011-10-22 MED ORDER — PHENYLEPHRINE HCL 10 MG/ML IJ SOLN
INTRAMUSCULAR | Status: DC | PRN
  Administered 2011-10-22: 40 ug via INTRAVENOUS
  Administered 2011-10-22: 80 ug via INTRAVENOUS

## 2011-10-22 MED ORDER — FENTANYL CITRATE 0.05 MG/ML IJ SOLN
INTRAMUSCULAR | Status: DC | PRN
  Administered 2011-10-22 (×2): 50 ug via INTRAVENOUS
  Administered 2011-10-22: 100 ug via INTRAVENOUS
  Administered 2011-10-22 (×2): 50 ug via INTRAVENOUS
  Administered 2011-10-22: 75 ug via INTRAVENOUS
  Administered 2011-10-22: 25 ug via INTRAVENOUS
  Administered 2011-10-22 (×2): 50 ug via INTRAVENOUS

## 2011-10-22 MED ORDER — LUNG SURGERY BOOK
Freq: Once | Status: AC
  Administered 2011-10-22: 15:00:00
  Filled 2011-10-22: qty 1

## 2011-10-22 MED ORDER — POTASSIUM CHLORIDE 10 MEQ/50ML IV SOLN
10.0000 meq | Freq: Every day | INTRAVENOUS | Status: DC | PRN
  Administered 2011-10-23 – 2011-10-24 (×8): 10 meq via INTRAVENOUS
  Filled 2011-10-22 (×4): qty 50
  Filled 2011-10-22 (×2): qty 100

## 2011-10-22 MED ORDER — LORAZEPAM 2 MG/ML IJ SOLN: 1.0000 mg | Freq: Once | INTRAMUSCULAR | Status: DC | PRN

## 2011-10-22 MED ORDER — MIDAZOLAM HCL 2 MG/2ML IJ SOLN: 1.0000 mg | INTRAMUSCULAR | Status: DC | PRN

## 2011-10-22 MED ORDER — ACETAMINOPHEN 10 MG/ML IV SOLN
1000.0000 mg | Freq: Four times a day (QID) | INTRAVENOUS | Status: AC
  Administered 2011-10-22 – 2011-10-23 (×3): 1000 mg via INTRAVENOUS
  Filled 2011-10-22 (×4): qty 100

## 2011-10-22 MED ORDER — HYDROMORPHONE HCL PF 1 MG/ML IJ SOLN
INTRAMUSCULAR | Status: AC
  Filled 2011-10-22: qty 1

## 2011-10-22 MED ORDER — SIMVASTATIN 40 MG PO TABS
40.0000 mg | ORAL_TABLET | Freq: Every day | ORAL | Status: DC
  Administered 2011-10-22 – 2011-10-25 (×4): 40 mg via ORAL
  Filled 2011-10-22 (×5): qty 1

## 2011-10-22 MED ORDER — LACTATED RINGERS IV SOLN
INTRAVENOUS | Status: DC | PRN
  Administered 2011-10-22 (×2): via INTRAVENOUS

## 2011-10-22 MED ORDER — ONDANSETRON HCL 4 MG/2ML IJ SOLN: 4.0000 mg | Freq: Four times a day (QID) | INTRAMUSCULAR | Status: DC | PRN

## 2011-10-22 MED ORDER — BUPIVACAINE-EPINEPHRINE 0.25% -1:200000 IJ SOLN
INTRAMUSCULAR | Status: DC | PRN
  Administered 2011-10-22: 29 mL

## 2011-10-22 MED ORDER — BUPIVACAINE ON-Q PAIN PUMP (FOR ORDER SET NO CHG)
INJECTION | Status: DC
  Filled 2011-10-22: qty 1

## 2011-10-22 MED ORDER — HYDROMORPHONE HCL PF 1 MG/ML IJ SOLN
0.2500 mg | INTRAMUSCULAR | Status: DC | PRN
  Administered 2011-10-22 (×4): 0.5 mg via INTRAVENOUS
  Filled 2011-10-22: qty 0.5

## 2011-10-22 MED ORDER — 0.9 % SODIUM CHLORIDE (POUR BTL) OPTIME
TOPICAL | Status: DC | PRN
  Administered 2011-10-22: 2000 mL

## 2011-10-22 MED ORDER — PANTOPRAZOLE SODIUM 40 MG PO TBEC
40.0000 mg | DELAYED_RELEASE_TABLET | Freq: Every day | ORAL | Status: DC
  Administered 2011-10-22 – 2011-10-25 (×4): 40 mg via ORAL
  Filled 2011-10-22 (×4): qty 1

## 2011-10-22 MED ORDER — OXYCODONE-ACETAMINOPHEN 5-325 MG PO TABS
1.0000 | ORAL_TABLET | ORAL | Status: DC | PRN
  Administered 2011-10-23 – 2011-10-24 (×2): 1 via ORAL
  Administered 2011-10-24 – 2011-10-26 (×3): 2 via ORAL
  Filled 2011-10-22: qty 1
  Filled 2011-10-22 (×2): qty 2
  Filled 2011-10-22: qty 1
  Filled 2011-10-22: qty 2

## 2011-10-22 MED ORDER — OXYCODONE HCL 5 MG PO TABS
5.0000 mg | ORAL_TABLET | ORAL | Status: AC | PRN
  Administered 2011-10-23 (×2): 10 mg via ORAL
  Filled 2011-10-22 (×2): qty 2

## 2011-10-22 MED ORDER — GLYCOPYRROLATE 0.2 MG/ML IJ SOLN
INTRAMUSCULAR | Status: DC | PRN
  Administered 2011-10-22: .7 mg via INTRAVENOUS

## 2011-10-22 MED ORDER — LACTATED RINGERS IV SOLN
INTRAVENOUS | Status: DC | PRN
  Administered 2011-10-22: 07:00:00 via INTRAVENOUS

## 2011-10-22 MED ORDER — KCL IN DEXTROSE-NACL 20-5-0.45 MEQ/L-%-% IV SOLN
INTRAVENOUS | Status: DC
  Filled 2011-10-22 (×5): qty 1000

## 2011-10-22 MED ORDER — TRAMADOL HCL 50 MG PO TABS
50.0000 mg | ORAL_TABLET | Freq: Four times a day (QID) | ORAL | Status: DC | PRN
  Administered 2011-10-24 – 2011-10-25 (×2): 100 mg via ORAL
  Filled 2011-10-22 (×2): qty 2

## 2011-10-22 MED ORDER — DEXTROSE 5 % IV SOLN
1.5000 g | Freq: Two times a day (BID) | INTRAVENOUS | Status: AC
  Administered 2011-10-22 – 2011-10-23 (×2): 1.5 g via INTRAVENOUS
  Filled 2011-10-22 (×3): qty 1.5

## 2011-10-22 MED ORDER — VECURONIUM BROMIDE 10 MG IV SOLR
INTRAVENOUS | Status: DC | PRN
  Administered 2011-10-22: 2 mg via INTRAVENOUS

## 2011-10-22 MED ORDER — BISACODYL 5 MG PO TBEC
10.0000 mg | DELAYED_RELEASE_TABLET | Freq: Every day | ORAL | Status: DC
  Administered 2011-10-22 – 2011-10-25 (×4): 10 mg via ORAL
  Filled 2011-10-22 (×4): qty 2

## 2011-10-22 MED ORDER — PROPOFOL 10 MG/ML IV EMUL
INTRAVENOUS | Status: DC | PRN
  Administered 2011-10-22: 150 mg via INTRAVENOUS

## 2011-10-22 SURGICAL SUPPLY — 81 items
APPLICATOR TIP EXT COSEAL (VASCULAR PRODUCTS) IMPLANT
APPLIER CLIP ROT 10 11.4 M/L (STAPLE) ×2
BENZOIN TINCTURE PRP APPL 2/3 (GAUZE/BANDAGES/DRESSINGS) ×2 IMPLANT
CANISTER SUCTION 2500CC (MISCELLANEOUS) ×2 IMPLANT
CATH HYDRAGLIDE XL THORACIC (CATHETERS) ×2 IMPLANT
CATH KIT ON Q 5IN SLV (PAIN MANAGEMENT) ×2 IMPLANT
CATH THORACIC 28FR (CATHETERS) IMPLANT
CATH THORACIC 28FR RT ANG (CATHETERS) IMPLANT
CATH THORACIC 36FR (CATHETERS) IMPLANT
CATH THORACIC 36FR RT ANG (CATHETERS) IMPLANT
CATH URET ROBINSON 16FR STRL (CATHETERS) IMPLANT
CLIP APPLIE ROT 10 11.4 M/L (STAPLE) ×1 IMPLANT
CLIP TI MEDIUM 6 (CLIP) ×2 IMPLANT
CLOTH BEACON ORANGE TIMEOUT ST (SAFETY) ×2 IMPLANT
CONN Y 3/8X3/8X3/8  BEN (MISCELLANEOUS)
CONN Y 3/8X3/8X3/8 BEN (MISCELLANEOUS) IMPLANT
CONT SPEC 4OZ CLIKSEAL STRL BL (MISCELLANEOUS) ×8 IMPLANT
COVER SURGICAL LIGHT HANDLE (MISCELLANEOUS) ×4 IMPLANT
DECANTER SPIKE VIAL GLASS SM (MISCELLANEOUS) IMPLANT
DERMABOND ADHESIVE PROPEN (GAUZE/BANDAGES/DRESSINGS) ×1
DERMABOND ADVANCED (GAUZE/BANDAGES/DRESSINGS) ×1
DERMABOND ADVANCED .7 DNX12 (GAUZE/BANDAGES/DRESSINGS) ×1 IMPLANT
DERMABOND ADVANCED .7 DNX6 (GAUZE/BANDAGES/DRESSINGS) ×1 IMPLANT
DRAPE CAMERA CLOSED 9X96 (DRAPES) IMPLANT
DRAPE LAPAROSCOPIC ABDOMINAL (DRAPES) ×2 IMPLANT
DRAPE WARM FLUID 44X44 (DRAPE) ×2 IMPLANT
DRILL BIT 7/64X5 (BIT) ×2 IMPLANT
ELECT REM PT RETURN 9FT ADLT (ELECTROSURGICAL) ×2
ELECTRODE REM PT RTRN 9FT ADLT (ELECTROSURGICAL) ×1 IMPLANT
GLOVE BIO SURGEON STRL SZ 6 (GLOVE) ×2 IMPLANT
GLOVE BIO SURGEON STRL SZ 6.5 (GLOVE) ×4 IMPLANT
GLOVE BIOGEL PI IND STRL 6.5 (GLOVE) ×1 IMPLANT
GLOVE BIOGEL PI IND STRL 7.0 (GLOVE) ×2 IMPLANT
GLOVE BIOGEL PI INDICATOR 6.5 (GLOVE) ×1
GLOVE BIOGEL PI INDICATOR 7.0 (GLOVE) ×2
GLOVE SURG SIGNA 7.5 PF LTX (GLOVE) ×4 IMPLANT
GOWN BRE IMP PREV XXLGXLNG (GOWN DISPOSABLE) ×2 IMPLANT
GOWN STRL NON-REIN LRG LVL3 (GOWN DISPOSABLE) ×4 IMPLANT
HANDLE STAPLE ENDO GIA SHORT (STAPLE) ×1
HEMOSTAT SURGICEL 2X14 (HEMOSTASIS) IMPLANT
KIT BASIN OR (CUSTOM PROCEDURE TRAY) ×2 IMPLANT
KIT ROOM TURNOVER OR (KITS) ×2 IMPLANT
LIGASURE 5MM LAPAROSCOPIC (INSTRUMENTS) IMPLANT
NEEDLE SPNL 22GX3.5 QUINCKE BK (NEEDLE) ×2 IMPLANT
NS IRRIG 1000ML POUR BTL (IV SOLUTION) ×4 IMPLANT
PACK CHEST (CUSTOM PROCEDURE TRAY) ×2 IMPLANT
PAD ARMBOARD 7.5X6 YLW CONV (MISCELLANEOUS) ×4 IMPLANT
POUCH SPECIMEN RETRIEVAL 10MM (ENDOMECHANICALS) ×2 IMPLANT
RELOAD EGIA 60 TAN VASC (STAPLE) ×6 IMPLANT
RELOAD GIA II 30 3.5 (ENDOMECHANICALS) ×2 IMPLANT
SEALANT PROGEL (MISCELLANEOUS) IMPLANT
SEALANT SURG COSEAL 4ML (VASCULAR PRODUCTS) IMPLANT
SEALANT SURG COSEAL 8ML (VASCULAR PRODUCTS) IMPLANT
SOLUTION ANTI FOG 6CC (MISCELLANEOUS) ×2 IMPLANT
SPONGE GAUZE 4X4 12PLY (GAUZE/BANDAGES/DRESSINGS) ×2 IMPLANT
STAPLER ENDO GIA 12MM SHORT (STAPLE) ×1 IMPLANT
STRIP PERI DRY VERITAS 45 (STAPLE) IMPLANT
STRIP PERI DRY VERITAS 60 (STAPLE) IMPLANT
SUT PROLENE 3 0 SH DA (SUTURE) IMPLANT
SUT PROLENE 4 0 RB 1 (SUTURE)
SUT PROLENE 4-0 RB1 .5 CRCL 36 (SUTURE) IMPLANT
SUT SILK 0 FSL (SUTURE) ×8 IMPLANT
SUT SILK 2 0SH CR/8 30 (SUTURE) IMPLANT
SUT VIC AB 1 CTX 18 (SUTURE) IMPLANT
SUT VIC AB 1 CTX 36 (SUTURE) ×1
SUT VIC AB 1 CTX36XBRD ANBCTR (SUTURE) ×1 IMPLANT
SUT VIC AB 2-0 CTX 36 (SUTURE) ×2 IMPLANT
SUT VIC AB 3-0 MH 27 (SUTURE) ×2 IMPLANT
SUT VIC AB 3-0 X1 27 (SUTURE) ×2 IMPLANT
SUT VICRYL 2 TP 1 (SUTURE) ×2 IMPLANT
SYRINGE 10CC LL (SYRINGE) ×2 IMPLANT
SYSTEM SAHARA CHEST DRAIN RE-I (WOUND CARE) ×2 IMPLANT
TAPE CLOTH 4X10 WHT NS (GAUZE/BANDAGES/DRESSINGS) ×2 IMPLANT
TAPE CLOTH SURG 4X10 WHT LF (GAUZE/BANDAGES/DRESSINGS) ×2 IMPLANT
TAPE UMBILICAL COTTON 1/8X30 (MISCELLANEOUS) ×2 IMPLANT
TIP APPLICATOR SPRAY EXTEND 16 (VASCULAR PRODUCTS) IMPLANT
TOWEL OR 17X24 6PK STRL BLUE (TOWEL DISPOSABLE) ×4 IMPLANT
TOWEL OR 17X26 10 PK STRL BLUE (TOWEL DISPOSABLE) ×4 IMPLANT
TRAY FOLEY CATH 14FRSI W/METER (CATHETERS) ×2 IMPLANT
TUNNELER SHEATH ON-Q 11GX8 (MISCELLANEOUS) ×2 IMPLANT
WATER STERILE IRR 1000ML POUR (IV SOLUTION) ×4 IMPLANT

## 2011-10-22 NOTE — Interval H&P Note (Signed)
History and Physical Interval Note:  10/22/2011 6:51 AM  Tiffany Mcneil  has presented today for surgery, with the diagnosis of LUNG MASS  The various methods of treatment have been discussed with the patient and family. After consideration of risks, benefits and other options for treatment, the patient has consented to  Procedure(s) (LRB): VIDEO ASSISTED THORACOSCOPY (VATS)/ LOBECTOMY (Right) as a surgical intervention .  The patients' history has been reviewed, patient examined, no change in status, stable for surgery.  I have reviewed the patients' chart and labs.  Questions were answered to the patient's satisfaction.     Cameron Proud

## 2011-10-22 NOTE — Transfer of Care (Signed)
Immediate Anesthesia Transfer of Care Note  Patient: Tiffany Mcneil  Procedure(s) Performed: Procedure(s) (LRB): VIDEO ASSISTED THORACOSCOPY (VATS)/ LOBECTOMY (Right)  Patient Location: PACU  Anesthesia Type: General  Level of Consciousness: awake, oriented and patient cooperative  Airway & Oxygen Therapy: Patient Spontanous Breathing and Patient connected to nasal cannula oxygen  Post-op Assessment: Report given to PACU RN, Post -op Vital signs reviewed and stable and Patient moving all extremities X 4  Post vital signs: Reviewed  Complications: No apparent anesthesia complications

## 2011-10-22 NOTE — H&P (View-Only) (Signed)
HPI this 66-year-old patient is a former smoker. She had a chest x-ray in 2011 and had a recent chest x-ray that shows a new right upper lobe 12 mm lesion this is confirmed on CT scan. PET scan showed a standard uptake value of 1.3. Her previous surgery includes appendectomy hysterectomy and low back surgery. She smoked 25 years and quit smoking 20 years ago. He is a moderate use of alcohol he also gives a history of colonic polyps as well as hypertension and hypercholesterolemia. She's had no hemoptysis fever chills or excessive sputum. Since this is a new nodule in the right upper lobe resection was recommended. Patient agrees to the surgery and understands the risk and benefits.   Current Outpatient Prescriptions  Medication Sig Dispense Refill  . alendronate (FOSAMAX) 70 MG tablet Take 70 mg by mouth every 7 (seven) days. Take with a full glass of water on an empty stomach.      . amLODipine (NORVASC) 5 MG tablet Take 5 mg by mouth daily.      . atorvastatin (LIPITOR) 20 MG tablet Take 20 mg by mouth daily.      . clobetasol cream (TEMOVATE) 0.05 % Apply topically 2 (two) times daily.      . esomeprazole (NEXIUM) 40 MG capsule Take 40 mg by mouth daily before breakfast.      . fish oil-omega-3 fatty acids 1000 MG capsule Take 2 g by mouth daily.      . hydrochlorothiazide (MICROZIDE) 12.5 MG capsule Take 12.5 mg by mouth daily.      . HYDROcodone-homatropine (HYCODAN) 5-1.5 MG/5ML syrup Take 5 mLs by mouth every 6 (six) hours as needed.      . mometasone (NASONEX) 50 MCG/ACT nasal spray Place 2 sprays into the nose daily.      . Multiple Vitamins-Minerals (MULTIVITAL) tablet Take 1 tablet by mouth daily.      . benzonatate (TESSALON) 200 MG capsule Take 200 mg by mouth 3 (three) times daily as needed.      . nitroGLYCERIN (NITROSTAT) 0.4 MG SL tablet Place 0.4 mg under the tongue every 5 (five) minutes as needed.         Review of Systems: Her weight side and 70 pounds she is 5 foot 8 inch  hemorrhoids been stable. Cardiac no recent angina or atrial fibrillation. Pulmonary she has a cough that was thought to be secondary to lisinopril which is been stopped. GI she has reflux from previous appendectomy GU no kidney disease this year or frequent urination. Vascular no claudication DVT or TIAs. Neurology no dizziness headaches like as seizures. Musculoskeletal joint pain psychiatric no depression or nervousness. ENT no change in her Ieyesight or her hearing   Physical Exam  Constitutional: She is oriented to person, place, and time. She appears well-developed and well-nourished.  HENT:  Head: Atraumatic.  Right Ear: External ear normal.  Left Ear: External ear normal.  Mouth/Throat: Oropharynx is clear and moist.  Eyes: Conjunctivae and EOM are normal. Pupils are equal, round, and reactive to light.  Neck: Normal range of motion. Neck supple.  Cardiovascular: Normal rate and normal heart sounds.   Pulmonary/Chest: Effort normal and breath sounds normal. No respiratory distress.  Abdominal: Soft. Bowel sounds are normal.  Musculoskeletal: Normal range of motion. She exhibits no edema and no tenderness.  Lymphadenopathy:    She has no cervical adenopathy.  Neurological: She is alert and oriented to person, place, and time. She has normal reflexes. No cranial nerve deficit.   Coordination normal.  Skin: Skin is warm and dry.  Psychiatric: She has a normal mood and affect. Her behavior is normal. Judgment and thought content normal.     Diagnostic Tests: CT scan shows a 12 mm nodule in the right upper lobe with an SUV on PET 1.3 there also is a 10 x 7 mm nodule in the left upper lobe   Impression: Bilateral pulmonary nodules rule out adenocarcinoma in situ   Plan: Resection right upper lobe nodule      

## 2011-10-22 NOTE — Anesthesia Preprocedure Evaluation (Signed)
Anesthesia Evaluation  Patient identified by MRN, date of birth, ID band Patient awake    Reviewed: Allergy & Precautions, H&P , NPO status , Patient's Chart, lab work & pertinent test results  Airway Mallampati: I TM Distance: >3 FB Neck ROM: Full    Dental   Pulmonary  Lung nodule   Pulmonary exam normal       Cardiovascular hypertension,     Neuro/Psych    GI/Hepatic GERD-  ,  Endo/Other    Renal/GU      Musculoskeletal   Abdominal   Peds  Hematology   Anesthesia Other Findings   Reproductive/Obstetrics                           Anesthesia Physical Anesthesia Plan  ASA: III  Anesthesia Plan: General   Post-op Pain Management:    Induction: Intravenous  Airway Management Planned: Double Lumen EBT  Additional Equipment: CVP and Arterial line  Intra-op Plan:   Post-operative Plan: Extubation in OR  Informed Consent: I have reviewed the patients History and Physical, chart, labs and discussed the procedure including the risks, benefits and alternatives for the proposed anesthesia with the patient or authorized representative who has indicated his/her understanding and acceptance.     Plan Discussed with: CRNA and Surgeon  Anesthesia Plan Comments:         Anesthesia Quick Evaluation

## 2011-10-22 NOTE — Brief Op Note (Addendum)
10/22/2011  9:37 AM  PATIENT:  Tiffany Mcneil  66 y.o. female  PRE-OPERATIVE DIAGNOSIS:  Right Upper Lobe Lung Mass  POST-OPERATIVE DIAGNOSIS: Adenocarcinoma  PROCEDURE:  Procedure(s): RIGHT VIDEO ASSISTED THORACOSCOPY (VATS), RIGHT UPPER LOBE WEDGE RESECTION, NODE DISSECTION  SURGEON:  Surgeon(s): Ines Bloomer, MD  ASSISTANT: Coral Ceo, PA-C  ANESTHESIA:   general  SPECIMEN:  Source of Specimen:  Right upper lobe  DISPOSITION OF SPECIMEN:  Pathology  DRAINS: 24 Fr Ct  PATIENT CONDITION:  PACU - hemodynamically stable.    Dictation # [id number]

## 2011-10-22 NOTE — Anesthesia Postprocedure Evaluation (Signed)
  Anesthesia Post-op Note  Patient: Tiffany Mcneil  Procedure(s) Performed: Procedure(s) (LRB): VIDEO ASSISTED THORACOSCOPY (VATS)/ LOBECTOMY (Right)  Patient Location: PACU  Anesthesia Type: General  Level of Consciousness: awake  Airway and Oxygen Therapy: Patient Spontanous Breathing  Post-op Pain: mild  Post-op Assessment: Post-op Vital signs reviewed, Patient's Cardiovascular Status Stable, Respiratory Function Stable, Patent Airway, No signs of Nausea or vomiting and Pain level controlled  Post-op Vital Signs: stable  Complications: No apparent anesthesia complications

## 2011-10-22 NOTE — Preoperative (Signed)
Beta Blockers   Reason not to administer Beta Blockers:Not Applicable 

## 2011-10-23 ENCOUNTER — Inpatient Hospital Stay (HOSPITAL_COMMUNITY): Payer: Medicare Other

## 2011-10-23 LAB — BASIC METABOLIC PANEL
BUN: 6 mg/dL (ref 6–23)
Chloride: 101 mEq/L (ref 96–112)
Creatinine, Ser: 0.55 mg/dL (ref 0.50–1.10)
GFR calc Af Amer: >90 mL/min (ref >90)
GFR calc non Af Amer: >90 mL/min (ref >90)
Potassium: 3 mEq/L — ABNORMAL LOW (ref 3.5–5.1)

## 2011-10-23 LAB — POCT I-STAT 3, ART BLOOD GAS (G3+)
Acid-Base Excess: 1 mmol/L (ref 0.0–2.0)
Bicarbonate: 26.7 mEq/L — ABNORMAL HIGH (ref 20.0–24.0)
Patient temperature: 98.1
pH, Arterial: 7.383 (ref 7.350–7.400)
pO2, Arterial: 102 mmHg — ABNORMAL HIGH (ref 80.0–100.0)

## 2011-10-23 LAB — CBC
HCT: 34.3 % — ABNORMAL LOW (ref 36.0–46.0)
MCHC: 34.1 g/dL (ref 30.0–36.0)
Platelets: 235 10*3/uL (ref 150–400)
RDW: 13.7 % (ref 11.5–15.5)
WBC: 7.7 10*3/uL (ref 4.0–10.5)

## 2011-10-23 MED ORDER — POTASSIUM CHLORIDE IN NACL 40-0.9 MEQ/L-% IV SOLN
INTRAVENOUS | Status: DC
  Administered 2011-10-23: 10:00:00 via INTRAVENOUS
  Administered 2011-10-24: 20 mL/h via INTRAVENOUS
  Filled 2011-10-23 (×2): qty 1000

## 2011-10-23 MED ORDER — POLYSACCHARIDE IRON COMPLEX 150 MG PO CAPS
150.0000 mg | ORAL_CAPSULE | Freq: Every day | ORAL | Status: DC
  Administered 2011-10-23 – 2011-10-25 (×3): 150 mg via ORAL
  Filled 2011-10-23 (×4): qty 1

## 2011-10-23 MED ORDER — POTASSIUM CHLORIDE 10 MEQ/100ML IV SOLN
10.0000 meq | INTRAVENOUS | Status: AC
  Administered 2011-10-23: 10 meq via INTRAVENOUS

## 2011-10-23 NOTE — Progress Notes (Signed)
UR Completed.  Richanda Darin Jane [phone] 10/23/2011  

## 2011-10-23 NOTE — Op Note (Signed)
Tiffany Mcneil, Tiffany Mcneil               ACCOUNT NO.:  [id number]  MEDICAL RECORD NO.:  [id number]  LOCATION:  [facility]                         FACILITY:  [facility]  PHYSICIAN:  Ines Bloomer, M.D. DATE OF BIRTH:  Feb 19, 1946  DATE OF PROCEDURE: DATE OF DISCHARGE:                              OPERATIVE REPORT   PREOPERATIVE DIAGNOSIS:  Right upper lobe nodule.  POSTOPERATIVE DIAGNOSIS:  Right upper lobe nodule, adenocarcinoma with BAC features.  OPERATION PERFORMED:  Right VATS, resection of right upper lobe with node sampling.  I am going to go over the labs for postoperative diagnosis after having operation performed.  SURGEON:  Ines Bloomer, M.D.  FIRST ASSISTANT:  Coral Ceo, P.A.  ANESTHESIA:  General anesthesia.  After percutaneous insertion of all monitor lines, the patient with general anesthesia was prepped and draped in usual sterile manner, was turned to the right lateral thoracotomy position.  Prepped and draped in usual sterile manner.  Dual-lumen tube was inserted.  The right lung was deflated.  Two trocar sites were made in the anterior and posterior axillary line to seventh intercostal space.  The trocar was inserted and anterior access incision was made over the fourth intercostal space, and through that, we palpated the nodule in the posterior segment of the right upper lobe.  Since this had a very low SUV, we decided to do a wedge resection of this rather than proceeding with the VATS lobectomy. We grasped the nodule with a lung clamp through the access incision then coming in medially, used the Covidien stapler to resect getting very good margins.  We then started dissecting anteriorly and dissected for looking for nodes and found 10R node which was removed.  There were some other small 7 nodes and this was dissected out.  We did not see any 4R nodes.  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  A #24 chest tube was inserted anteriorly, Marcaine block was due in the usual fashion.  The single On-Q was inserted in the usual fashion subpleurally and the chest was closed with #2-0 Vicryl in the muscle layer 3-0 Vicryl subcuticular stitch and Dermabond for the skin.  The patient was turned to recovery room in stable condition.    Ines Bloomer, M.D.    DPB/MEDQ  D:  10/22/2011  T:  10/22/2011  Job:  [id number]

## 2011-10-23 NOTE — Progress Notes (Signed)
Pt transferred from 2300.  NAD, oriented to rm.  Callbell within reach.

## 2011-10-23 NOTE — Progress Notes (Signed)
                                              1 Day Post-Op Procedure(s) (LRB): VIDEO ASSISTED THORACOSCOPY (VATS)/ LOBECTOMY (Right) Subjective: The patient has been stable throughout the night. Potassium is 3.0 vital signs are stable chest x-ray shows normal postoperative changes. There is no airleak. Plan to transfer to 3300. Orders written.  Objective: Vital signs in last 24 hours: Temp:  [97.2 F (36.2 C)-98.1 F (36.7 C)] 97.9 F (36.6 C) (04/12 0743) Pulse Rate:  [72-92] 76  (04/12 0700) Cardiac Rhythm:  [-] Normal sinus rhythm (04/12 0600) Resp:  [11-18] 13  (04/12 0400) BP: (114-134)/(57-81) 128/75 mmHg (04/12 0700) SpO2:  [96 %-100 %] 97 % (04/12 0700) Arterial Line BP: (126-157)/(52-74) 146/64 mmHg (04/12 0700) Weight:  [79.8 kg (175 lb 14.8 oz)-82.1 kg (181 lb)] 82.1 kg (181 lb) (04/12 0500)  Hemodynamic parameters for last 24 hours:    Intake/Output from previous day: 04/11 0701 - 04/12 0700 In: 4186 [P.O.:300; I.V.:3434; IV Piggyback:452] Out: 5300 [Urine:4840; Chest Tube:460] Intake/Output this shift:    General appearance: alert Heart: regular rate and rhythm, S1, S2 normal, no murmur, click, rub or gallop Lungs: clear to auscultation bilaterally  Lab Results:  Basename 10/23/11 0400  WBC 7.7  HGB 11.7*  HCT 34.3*  PLT 235   BMET:  Basename 10/23/11 0400  NA 135  K 3.0*  CL 101  CO2 27  GLUCOSE 142*  BUN 6  CREATININE 0.55  CALCIUM 8.3*    PT/INR: No results found for this basename: LABPROT,INR in the last 72 hours ABG    Component Value Date/Time   PHART 7.383 10/23/2011 0414   HCO3 26.7* 10/23/2011 0414   TCO2 28 10/23/2011 0414   O2SAT 98.0 10/23/2011 0414   CBG (last 3)  No results found for this basename: GLUCAP:3 in the last 72 hours  Assessment/Plan: S/P Procedure(s) (LRB): VIDEO ASSISTED THORACOSCOPY (VATS)/ LOBECTOMY (Right) Plan transfer to 3300 discontinue suction   LOS: 1 day    Shadow Schedler PATRICK 10/23/2011

## 2011-10-24 ENCOUNTER — Inpatient Hospital Stay (HOSPITAL_COMMUNITY): Payer: Medicare Other

## 2011-10-24 LAB — COMPREHENSIVE METABOLIC PANEL
AST: 20 U/L (ref 0–37)
BUN: 4 mg/dL — ABNORMAL LOW (ref 6–23)
CO2: 25 mEq/L (ref 19–32)
Calcium: 8.2 mg/dL — ABNORMAL LOW (ref 8.4–10.5)
Chloride: 103 mEq/L (ref 96–112)
Creatinine, Ser: 0.6 mg/dL (ref 0.50–1.10)
GFR calc Af Amer: >90 mL/min (ref >90)
GFR calc non Af Amer: >90 mL/min (ref >90)
Glucose, Bld: 92 mg/dL (ref 70–99)
Total Bilirubin: 0.4 mg/dL (ref 0.3–1.2)

## 2011-10-24 LAB — CBC
HCT: 34.2 % — ABNORMAL LOW (ref 36.0–46.0)
Hemoglobin: 11.7 g/dL — ABNORMAL LOW (ref 12.0–15.0)
MCH: 30.1 pg (ref 26.0–34.0)
MCV: 87.9 fL (ref 78.0–100.0)
Platelets: 240 10*3/uL (ref 150–400)
RBC: 3.89 MIL/uL (ref 3.87–5.11)
WBC: 6.1 10*3/uL (ref 4.0–10.5)

## 2011-10-24 NOTE — Progress Notes (Signed)
                                              2 Days Post-Op Procedure(s) (LRB): VIDEO ASSISTED THORACOSCOPY (VATS)/ LOBECTOMY (Right) Subjective: Patient's chest x-ray is stable. Hematocrit is stable. Other labs pending. No airleak. Doing well overall. 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 Will send for EGFR and Alk studies .Objective: Vital signs in last 24 hours: Temp:  [97.5 F (36.4 C)-98.3 F (36.8 C)] 97.5 F (36.4 C) (04/13 0255) Pulse Rate:  [74-89] 83  (04/13 0255) Cardiac Rhythm:  [-] Normal sinus rhythm (04/13 0300) Resp:  [11-33] 14  (04/13 0255) BP: (121-129)/(60-79) 121/76 mmHg (04/13 0255) SpO2:  [95 %-98 %] 96 % (04/13 0741) Arterial Line BP: (145)/(62-63) 145/62 mmHg (04/12 0900)  Hemodynamic parameters for last 24 hours:    Intake/Output from previous day: 04/12 0701 - 04/13 0700 In: 2271 [P.O.:1060; I.V.:1061; IV Piggyback:150] Out: 7540 [Urine:7350; Chest Tube:190] Intake/Output this shift:    General appearance: alert Heart: regular rate and rhythm, S1, S2 normal, no murmur, click, rub or gallop Lungs: clear to auscultation bilaterally  Lab Results:  Basename 10/24/11 0600 10/23/11 0400  WBC 6.1 7.7  HGB 11.7* 11.7*  HCT 34.2* 34.3*  PLT 240 235   BMET:  Basename 10/23/11 0400  NA 135  K 3.0*  CL 101  CO2 27  GLUCOSE 142*  BUN 6  CREATININE 0.55  CALCIUM 8.3*    PT/INR: No results found for this basename: LABPROT,INR in the last 72 hours ABG    Component Value Date/Time   PHART 7.383 10/23/2011 0414   HCO3 26.7* 10/23/2011 0414   TCO2 28 10/23/2011 0414   O2SAT 98.0 10/23/2011 0414   CBG (last 3)  No results found for this basename: GLUCAP:3 in the last 72 hours  Assessment/Plan: S/P Procedure(s) (LRB): VIDEO ASSISTED THORACOSCOPY (VATS)/ LOBECTOMY (Right) DC chest tube DC on cue DC Foley continued PCA 24 more hours. Repeat potassium   LOS: 2 days    Knight Oelkers [facility] 10/24/2011

## 2011-10-24 NOTE — Progress Notes (Signed)
Fentanyl PCA 44 ml wasted in sink. Witnessed by Raymon Mutton, RN. Renette Butters, Viona Gilmore

## 2011-10-25 ENCOUNTER — Inpatient Hospital Stay (HOSPITAL_COMMUNITY): Payer: Medicare Other

## 2011-10-25 LAB — BASIC METABOLIC PANEL
BUN: 8 mg/dL (ref 6–23)
GFR calc Af Amer: >90 mL/min (ref >90)
GFR calc non Af Amer: >90 mL/min (ref >90)
Potassium: 3.8 mEq/L (ref 3.5–5.1)

## 2011-10-25 MED ORDER — LISINOPRIL 10 MG PO TABS
10.0000 mg | ORAL_TABLET | Freq: Every day | ORAL | Status: DC
  Administered 2011-10-25: 10 mg via ORAL
  Filled 2011-10-25 (×2): qty 1

## 2011-10-25 MED ORDER — OXYCODONE-ACETAMINOPHEN 5-325 MG PO TABS: 1.0000 | ORAL_TABLET | ORAL | Status: DC | PRN

## 2011-10-25 MED ORDER — POLYSACCHARIDE IRON COMPLEX 150 MG PO CAPS: 150.0000 mg | ORAL_CAPSULE | Freq: Every day | ORAL | Status: DC

## 2011-10-25 MED ORDER — OMEGA-3-ACID ETHYL ESTERS 1 G PO CAPS: 1.0000 g | ORAL_CAPSULE | Freq: Every day | ORAL | Status: DC

## 2011-10-25 MED ORDER — TRAMADOL HCL 50 MG PO TABS: 50.0000 mg | ORAL_TABLET | Freq: Four times a day (QID) | ORAL | Status: DC | PRN

## 2011-10-25 NOTE — Discharge Summary (Signed)
Physician Discharge Summary  Patient ID: Tiffany Mcneil MRN: [id number] DOB/AGE: 10-01-1945 66 y.o.  Admit date: 10/22/2011 Discharge date: 10/26/2011  Admission Diagnoses: Patient Active Problem List  Diagnoses  . Lung nodule  . Hyperlipidemia  . Osteoporosis  . Lipoma  . Bursitis of knee  . Hypertension  . Allergic rhinitis  . GERD (gastroesophageal reflux disease)   Discharge Diagnoses:  Patient Active Problem List  Diagnoses  . Lung nodule  . Hyperlipidemia  . Osteoporosis  . Lipoma  . Bursitis of knee  . Hypertension  . Allergic rhinitis  . GERD (gastroesophageal reflux disease)   S/P VATS with RUL Wedge Resection    Discharged Condition: good  History of Present Illness:  Ms. Tiffany Mcneil is a 66 yo white female who was referred to Dr. Edwyna Shell on 10/15/2011 after a CXR revealed a new 12mm RUL Lung nodule.  The patient underwent CT scan which confirmed the presence of the nodule.  Further evaluation with PET CT revealed a standard uptake value of 1.3.  The patient has a significant 25 year smoking history, however she has not smoked in 20 years.  The patient was evaluated and it was felt due to this finding she would benefit from surgical resection of this lung nodule.  The benefits and risks of the procedure were explained to the patient and she was agreeable to proceed with surgical resection.     Hospital Course:   On 10/22/2011 the patient presented to [facility] and underwent Right sided Video Assisted Thoracotomy with wedge resection of the Right Upper Lobe and Lymph node dissection.  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  The patient tolerated the procedure well.  She was extubated and taken to the PACU in stable condition.  POD #1 the patients chest tube was in place with no evidence of air leak.  She was transferred to stepdown in stable condition.  POD #2 the patients chest tube and  foley catheter were removed without difficulty.  POD #3 the patients chest xray is stable with no evidence of pneumothorax.  Her PCA was discontinued.  She is doing well.  Should no further issues arise she will likely be discharged home in the next 24-48 hours.  Her final pathology revealed negative lymph nodes for Stage IA Adenocarcinoma  Disposition: 01-Home or Self Care  Discharge Orders    Future Appointments: Provider: Department: Dept Phone: Center:   02/08/2012 10:00 AM [id number] [address] [id number] Mri [phone] [facility]     Medication List  As of 10/26/2011 10:54 AM   STOP taking these medications         lisinopril-hydrochlorothiazide 20-12.5 MG per tablet         TAKE these medications         alendronate 70 MG tablet   Commonly known as: FOSAMAX   Take 70 mg by mouth every 7 (seven) days. On Sunday  - Take with a full glass of water on an empty stomach.      atorvastatin 20 MG tablet   Commonly known as: LIPITOR   Take 20 mg by mouth at bedtime.      esomeprazole 40 MG capsule   Commonly known as: NEXIUM   Take 40 mg by mouth daily before breakfast.      fish oil-omega-3 fatty acids 1000 MG capsule   Take 1 g by mouth daily.      iron polysaccharides 150 MG capsule  Commonly known as: NIFEREX   Take 1 capsule (150 mg total) by mouth daily.      mometasone 50 MCG/ACT nasal spray   Commonly known as: NASONEX   Place 2 sprays into the nose daily as needed. For allergies      MULTIVITAL tablet   Take 1 tablet by mouth daily.      nitroGLYCERIN 0.4 MG SL tablet   Commonly known as: NITROSTAT   Place 0.4 mg under the tongue every 5 (five) minutes as needed. For chest pain      oxyCODONE-acetaminophen 5-325 MG per tablet   Commonly known as: PERCOCET   Take 1-2 tablets by mouth every 4 (four) hours as needed.      valsartan-hydrochlorothiazide 160-12.5 MG per tablet   Commonly known as: DIOVAN-HCT   Take 1 tablet by mouth Daily.                     Follow-up Information    Follow up with Cameron Proud, MD in 2 weeks. (Office will call you with an appointment)    Contact information:   [address] [place] [address] [phone]       Follow up with [facility] in 2 weeks. (Please get chest xray performed one hour prior to appointment with Dr. Edwyna Shell)    Contact information:   [address]         Signed: Lowella Dandy 10/25/2011, 12:52 PM

## 2011-10-25 NOTE — Progress Notes (Signed)
                                              3 Days Post-Op Procedure(s) (LRB): VIDEO ASSISTED THORACOSCOPY (VATS)/ LOBECTOMY (Right) Subjective: Chest x-ray is stable after chest tube removal. Potassium is 3.8. Wounds are healing. The leading without difficulty. Discontinue PCA. Plan discharge Monday or Tuesday. Objective: Vital signs in last 24 hours: Temp:  [97.3 F (36.3 C)-97.9 F (36.6 C)] 97.4 F (36.3 C) (04/14 0300) Pulse Rate:  [81-88] 82  (04/14 0300) Cardiac Rhythm:  [-] Normal sinus rhythm (04/14 0300) Resp:  [12-19] 12  (04/14 0300) BP: (105-133)/(64-86) 105/64 mmHg (04/14 0300) SpO2:  [94 %-98 %] 95 % (04/14 0300)  Hemodynamic parameters for last 24 hours:    Intake/Output from previous day: 04/13 0701 - 04/14 0700 In: 1375 [P.O.:720; I.V.:505; IV Piggyback:150] Out: 569 [Urine:429; Chest Tube:140] Intake/Output this shift:    General appearance: alert Heart: regular rate and rhythm, S1, S2 normal, no murmur, click, rub or gallop Lungs: clear to auscultation bilaterally  Lab Results:  Basename 10/24/11 0600 10/23/11 0400  WBC 6.1 7.7  HGB 11.7* 11.7*  HCT 34.2* 34.3*  PLT 240 235   BMET:  Basename 10/25/11 0600 10/24/11 0600  NA 137 136  K 3.8 3.7  CL 105 103  CO2 25 25  GLUCOSE 90 92  BUN 8 4*  CREATININE 0.66 0.60  CALCIUM 8.5 8.2*    PT/INR: No results found for this basename: LABPROT,INR in the last 72 hours ABG    Component Value Date/Time   PHART 7.383 10/23/2011 0414   HCO3 26.7* 10/23/2011 0414   TCO2 28 10/23/2011 0414   O2SAT 98.0 10/23/2011 0414   CBG (last 3)  No results found for this basename: GLUCAP:3 in the last 72 hours  Assessment/Plan: S/P Procedure(s) (LRB): VIDEO ASSISTED THORACOSCOPY (VATS)/ LOBECTOMY (Right) Discontinue PCA. Increase ambulation. Discharge Monday or Tuesday.   LOS: 3 days    Euva Rundell [facility] 10/25/2011

## 2011-10-25 NOTE — Discharge Instructions (Signed)
Thoracotomy Care After Refer to this sheet in the next few weeks. These instructions provide you with information on caring for yourself after your procedure. Your caregiver may also give you more specific instructions. Your treatment has been planned according to current medical practices, but problems sometimes occur. Call your caregiver if you have any problems or questions after your procedure. HOME CARE INSTRUCTIONS  Remove the bandage (dressing) over your chest tube site as directed by your caregiver.   It is normal to be sore for a couple weeks following surgery. See your caregiver if this seems to be getting worse rather than better.   Only take over-the-counter or prescription medicines for pain, discomfort, or fever as directed by your caregiver. It is very important to take pain medicine when you need it so that you will cough and breathe deeply enough to clear mucus (phlegm) and expand your lungs. This helps prevent a lung infection (pneumonia).   If it hurts to cough, hold a pillow against your chest when you cough. This may help with the discomfort. In spite of the discomfort, cough frequently.   Taking deep breaths keeps lungs inflated and protects against pneumonia. Most patients will go home with a device called an incentive spirometer that encourages deep breathing.   You may resume a normal diet and activities as directed.   Use showers for bathing until you see your caregiver, or as instructed.   Change dressings if necessary or as directed.   Avoid lifting or driving until you are instructed otherwise.   Make an appointment to see your caregiver for stitch (suture) or staple removal when instructed.   Do not travel by airplane for 2 weeks after the chest tube is removed.  SEEK MEDICAL CARE IF:  You are bleeding from your wounds.   Your heartbeat seems irregular.   You have redness, swelling, or increasing pain in the wounds.   There is pus coming from your  wounds.   There is a bad smell coming from the wound or dressing.  SEEK IMMEDIATE MEDICAL CARE IF:  You have a fever.   You develop a rash.   You have difficulty breathing.   You develop any reaction or side effects to medicines given.   You develop lightheadedness or feel faint.   You develop shortness of breath or chest pain.  MAKE SURE YOU:  Understand these instructions.   Will watch your condition.   Will get help right away if you are not doing well or get worse.  Document Released: 12/12/2010 Document Revised: 06/18/2011 Document Reviewed: 12/12/2010 [employer] Patient Information 2012 [employer]. 

## 2011-10-26 ENCOUNTER — Inpatient Hospital Stay (HOSPITAL_COMMUNITY): Payer: Medicare Other

## 2011-10-26 MED ORDER — OXYCODONE-ACETAMINOPHEN 5-325 MG PO TABS: 1.0000 | ORAL_TABLET | ORAL | Status: DC | PRN

## 2011-10-26 NOTE — Progress Notes (Signed)
Pt to be discharged per MD order. VSS. Discharged instructions given pt verbalized understanding, forms signed, copy given along with scripts. IV removed. No Signs of infection at incision site.

## 2011-10-26 NOTE — Progress Notes (Addendum)
4 Days Post-Op Procedure(s) (LRB): VIDEO ASSISTED THORACOSCOPY (VATS)/ LOBECTOMY (Right)  Subjective: Patient has a dry cough this am;otherwise, no complaints.  Objective: Vital signs in last 24 hours: Patient Vitals for the past 24 hrs:  BP Temp Temp src Pulse Resp SpO2  10/26/11 0400 - 98.1 F (36.7 C) Oral - - -  10/26/11 0335 99/53 mmHg 98.1 F (36.7 C) Oral 78  14  96 %  10/26/11 0015 107/62 mmHg 97.8 F (36.6 C) Oral 81  16  96 %  10/26/11 0001 - 97.8 F (36.6 C) Oral - - -  10/25/11 2000 - 97.9 F (36.6 C) Oral - - -  10/25/11 1955 114/64 mmHg - - 82  18  98 %  10/25/11 1620 108/63 mmHg 97.8 F (36.6 C) Oral 83  18  98 %  10/25/11 1135 137/75 mmHg 97.5 F (36.4 C) Oral 100  20  100 %  10/25/11 0815 127/80 mmHg 97.6 F (36.4 C) Oral 79  17  96 %    Current Weight  10/23/11 181 lb (82.1 kg)       Intake/Output from previous day: 04/14 0701 - 04/15 0700 In: 825 [P.O.:825] Out: 2 [Urine:1; Stool:1]   Physical Exam:  Cardiovascular: RRR, no murmurs, gallops, or rubs. Pulmonary: Clear to auscultation bilaterally; no rales, wheezes, or rhonchi. Abdomen: Soft, non tender, bowel sounds present. Extremities: No lower extremity edema. Wounds: Clean and dry.  No erythema or signs of infection.  Lab Results: CBC: Basename 10/24/11 0600  WBC 6.1  HGB 11.7*  HCT 34.2*  PLT 240   BMET:  Basename 10/25/11 0600 10/24/11 0600  NA 137 136  K 3.8 3.7  CL 105 103  CO2 25 25  GLUCOSE 90 92  BUN 8 4*  CREATININE 0.66 0.60  CALCIUM 8.5 8.2*    PT/INR: No results found for this basename: LABPROT,INR in the last 72 hours ABG:  INR: Will add last result for INR, ABG once components are confirmed Will add last 4 CBG results once components are confirmed  Assessment/Plan:  1.Pulmonary-CXR this am is stable (no pneumothorax, trace bilateral pleural effusions). 2.Dry cough likely secondary to Lisinopril, which patient is allergic to. Will stop. 3.Discharge after  seen by Dr. Edwyna Shell.  ZIMMERMAN,DONIELLE MPA-C 10/26/2011   Chest x-ray is stable will discharge agree with stopping lisinopril. We'll followup in the office.

## 2011-10-27 LAB — TYPE AND SCREEN
Antibody Screen: NEGATIVE
Unit division: 0

## 2011-10-28 ENCOUNTER — Other Ambulatory Visit: Payer: Self-pay | Admitting: Thoracic Surgery

## 2011-10-28 DIAGNOSIS — D381 Neoplasm of uncertain behavior of trachea, bronchus and lung: Secondary | ICD-10-CM

## 2011-10-30 ENCOUNTER — Other Ambulatory Visit: Payer: Self-pay | Admitting: *Deleted

## 2011-10-30 DIAGNOSIS — G8918 Other acute postprocedural pain: Secondary | ICD-10-CM

## 2011-10-30 MED ORDER — HYDROCODONE-ACETAMINOPHEN 7.5-500 MG PO TABS: 1.0000 | ORAL_TABLET | Freq: Four times a day (QID) | ORAL | Status: AC | PRN

## 2011-11-03 ENCOUNTER — Encounter: Payer: Self-pay | Admitting: Thoracic Surgery

## 2011-11-03 ENCOUNTER — Ambulatory Visit (INDEPENDENT_AMBULATORY_CARE_PROVIDER_SITE_OTHER): Payer: Self-pay | Admitting: Thoracic Surgery

## 2011-11-03 ENCOUNTER — Ambulatory Visit
Admission: RE | Admit: 2011-11-03 | Discharge: 2011-11-03 | Disposition: A | Discharge status: Home / Self Care | Payer: Medicare Other | Source: Ambulatory Visit | Attending: Thoracic Surgery | Admitting: Thoracic Surgery

## 2011-11-03 VITALS — BP 107/73 | HR 84 | Resp 16 | Ht 68.0 in | Wt 179.5 lb

## 2011-11-03 DIAGNOSIS — Z09 Encounter for follow-up examination after completed treatment for conditions other than malignant neoplasm: Secondary | ICD-10-CM

## 2011-11-03 DIAGNOSIS — C3410 Malignant neoplasm of upper lobe, unspecified bronchus or lung: Secondary | ICD-10-CM

## 2011-11-03 DIAGNOSIS — D381 Neoplasm of uncertain behavior of trachea, bronchus and lung: Secondary | ICD-10-CM

## 2011-11-03 NOTE — Progress Notes (Signed)
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 I'll refer to Dr. Gwenyth Bouillon in 3 weeks we'll see her back in 3 weeks  Current Outpatient Prescriptions  Medication Sig Dispense Refill  . alendronate (FOSAMAX) 70 MG tablet Take 70 mg by mouth every 7 (seven) days. On Sunday  - Take with a full glass of water on an empty stomach.      Marland Kitchen atorvastatin (LIPITOR) 20 MG tablet Take 20 mg by mouth at bedtime.       Marland Kitchen esomeprazole (NEXIUM) 40 MG capsule Take 40 mg by mouth daily before breakfast.      . fish oil-omega-3 fatty acids 1000 MG capsule Take 1 g by mouth daily.      Marland Kitchen HYDROcodone-acetaminophen (LORTAB 7.5) 7.5-500 MG per tablet Take 1 tablet by mouth every 6 (six) hours as needed for pain (may take one or two tablets every 4-6 hrs prn).  40 tablet  0  . iron polysaccharides (NIFEREX) 150 MG capsule Take 1 capsule (150 mg total) by mouth daily.  30 capsule  0  . mometasone (NASONEX) 50 MCG/ACT nasal spray Place 2 sprays into the nose daily as needed. For allergies      . Multiple Vitamins-Minerals (MULTIVITAL) tablet Take 1 tablet by mouth daily.      . nitroGLYCERIN (NITROSTAT) 0.4 MG SL tablet Place 0.4 mg under the tongue every 5 (five) minutes as needed. For chest pain      . valsartan-hydrochlorothiazide (DIOVAN-HCT) 160-12.5 MG per tablet Take 1 tablet by mouth Daily.         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

## 2011-11-23 ENCOUNTER — Other Ambulatory Visit: Payer: Self-pay | Admitting: Thoracic Surgery

## 2011-11-23 DIAGNOSIS — D381 Neoplasm of uncertain behavior of trachea, bronchus and lung: Secondary | ICD-10-CM

## 2011-11-24 ENCOUNTER — Telehealth: Payer: Self-pay

## 2011-11-24 NOTE — Telephone Encounter (Signed)
I spoke with ingrid and she states they are wanting pt to be seen this week as a new consult for chronic cough. Pt has lung cancer and already see's an oncologists and has a Careers adviser. Per ingrid they are just wanting pt to be seen for her chronic cough. Pt is scheduled to come in and see MW on Friday at 9:30. She is aware to have pt arrive 15 min early and will fax over paperwork.

## 2011-11-25 ENCOUNTER — Ambulatory Visit
Admission: RE | Admit: 2011-11-25 | Discharge: 2011-11-25 | Disposition: A | Discharge status: Home / Self Care | Payer: Medicare Other | Source: Ambulatory Visit | Attending: Thoracic Surgery | Admitting: Thoracic Surgery

## 2011-11-25 ENCOUNTER — Ambulatory Visit (INDEPENDENT_AMBULATORY_CARE_PROVIDER_SITE_OTHER): Payer: Self-pay | Admitting: Thoracic Surgery

## 2011-11-25 ENCOUNTER — Encounter: Payer: Self-pay | Admitting: Thoracic Surgery

## 2011-11-25 VITALS — BP 123/80 | HR 80 | Resp 18 | Ht 68.0 in | Wt 170.0 lb

## 2011-11-25 DIAGNOSIS — Z9889 Other specified postprocedural states: Secondary | ICD-10-CM

## 2011-11-25 DIAGNOSIS — D381 Neoplasm of uncertain behavior of trachea, bronchus and lung: Secondary | ICD-10-CM

## 2011-11-25 DIAGNOSIS — C3490 Malignant neoplasm of unspecified part of unspecified bronchus or lung: Secondary | ICD-10-CM

## 2011-11-25 NOTE — Progress Notes (Signed)
HPI the patient returns for followup. Incisions are well-healed she released for activity. We will get an appointment to see Dr. Gwenyth Bouillon. Her EG RF was positive. Well overall. Current Outpatient Prescriptions  Medication Sig Dispense Refill  . alendronate (FOSAMAX) 70 MG tablet Take 70 mg by mouth every 7 (seven) days. On Sunday  - Take with a full glass of water on an empty stomach.      Marland Kitchen atorvastatin (LIPITOR) 20 MG tablet Take 20 mg by mouth at bedtime.       Marland Kitchen esomeprazole (NEXIUM) 40 MG capsule Take 40 mg by mouth daily before breakfast.      . fish oil-omega-3 fatty acids 1000 MG capsule Take 1 g by mouth daily.      . iron polysaccharides (NIFEREX) 150 MG capsule Take 1 capsule (150 mg total) by mouth daily.  30 capsule  0  . mometasone (NASONEX) 50 MCG/ACT nasal spray Place 2 sprays into the nose daily as needed. For allergies      . Multiple Vitamins-Minerals (MULTIVITAL) tablet Take 1 tablet by mouth daily.      . nitroGLYCERIN (NITROSTAT) 0.4 MG SL tablet Place 0.4 mg under the tongue every 5 (five) minutes as needed. For chest pain      . valsartan-hydrochlorothiazide (DIOVAN-HCT) 160-12.5 MG per tablet Take 1 tablet by mouth Daily.         Review of Systems: Unchanged   Physical Exam lungs are clear attestation percussion incisions are well-healed   Diagnostic Tests: Chest x-ray shows normal postoperative changes   Impression: Stage IA adenocarcinoma in situ   Plan: Return in 4 weeks with chest x-ray

## 2011-11-27 ENCOUNTER — Ambulatory Visit (INDEPENDENT_AMBULATORY_CARE_PROVIDER_SITE_OTHER): Payer: Medicare Other | Admitting: Internal Medicine

## 2011-11-27 ENCOUNTER — Encounter: Payer: Self-pay | Admitting: Internal Medicine

## 2011-11-27 ENCOUNTER — Telehealth: Payer: Self-pay | Admitting: Internal Medicine

## 2011-11-27 VITALS — BP 122/80 | HR 88 | Temp 97.6°F | Ht 68.0 in | Wt 172.0 lb

## 2011-11-27 DIAGNOSIS — I10 Essential (primary) hypertension: Secondary | ICD-10-CM

## 2011-11-27 DIAGNOSIS — R059 Cough, unspecified: Secondary | ICD-10-CM

## 2011-11-27 DIAGNOSIS — C3410 Malignant neoplasm of upper lobe, unspecified bronchus or lung: Secondary | ICD-10-CM

## 2011-11-27 DIAGNOSIS — R05 Cough: Secondary | ICD-10-CM | POA: Insufficient documentation

## 2011-11-27 DIAGNOSIS — M810 Age-related osteoporosis without current pathological fracture: Secondary | ICD-10-CM

## 2011-11-27 MED ORDER — PREDNISONE (PAK) 10 MG PO TABS: ORAL_TABLET | ORAL | Status: AC

## 2011-11-27 MED ORDER — TRAMADOL HCL 50 MG PO TABS: ORAL_TABLET | ORAL | Status: AC

## 2011-11-27 NOTE — Patient Instructions (Signed)
The key to effective treatment for your cough is eliminating the non-stop cycle of cough you're stuck in long enough to let your airway heal completely and then see if there is anything still making you cough once you stop the cough suppression, but this should take no more than 5 days to figuure out  First take delsym two tsp every 12 hours and supplement if needed with  tramadol 50 mg up to 2 every 4 hours to suppress the urge to cough. Swallowing water or using ice chips/non mint and menthol containing candies (such as lifesavers or sugarless jolly ranchers) are also effective.  You should rest your voice and avoid activities that you know make you cough.  Once you have eliminated the cough for 3 straight days try reducing the tramadol first,  then the delsym as tolerated.    Try Prilosec 20 mg   Take 30-60 min before first meal of the day and Pepcid 20 mg one bedtime   GERD (REFLUX)  is an extremely common cause of respiratory symptoms, many times with no significant heartburn at all.    It can be treated with medication, but also with lifestyle changes including avoidance of late meals, excessive alcohol, smoking cessation, and avoid fatty foods, chocolate, peppermint, colas, red wine, and acidic juices such as orange juice.  NO MINT OR MENTHOL PRODUCTS SO NO COUGH DROPS  USE SUGARLESS CANDY INSTEAD (jolley ranchers or Stover's)  NO OIL BASED VITAMINS - use powdered substitutes. NO FOSFAMAX    Prednisone 10 mg take  4 each am x 2 days,   2 each am x 2 days,  1 each am x2days and stop    If all better then stay on acid on acid suppression for three months and off fosfamax   If not better return in 2 weeks.

## 2011-11-27 NOTE — Telephone Encounter (Signed)
pt aware of her 5/29 new pt appt  aom

## 2011-11-27 NOTE — Progress Notes (Signed)
  Subjective:    Patient ID: Tiffany Mcneil, female    DOB: [birth year]  MRN: [id number]  HPI  43 yowf quit smoking 1985 with no resp problems but then developed problems with sinuses since moving to [place] 2009 year round "congestion" but not requiring ov's then Jan 2013 cough on ACEI stopped in February but failed to resolve so referred by Dr Nicholos Johns for pulmonary eval 11/27/2011   11/27/2011 1st pulmonary eval cc acute onset dry cough Jan 2013  Assoc with persistent mild hoarseness some better off acei. Cough resulted in cxr and finding of SPN RUL > rulobectomy 10/22/11 with adenoca pT1a, pN0 >  and a lot better p l surgery by Edwyna Shell   and completely cough free in hosp on narcotics, so overall better but recurred off narcotics and worse p lie down and  Can only sleep p hycodan then doesfine and does not wake up coughing.  Only sob when coughing  No overt sinus or hb complaints, Also denies any obvious fluctuation of symptoms with weather or environmental changes or other aggravating or alleviating factors except as outlined above   Review of Systems  Constitutional: Positive for unexpected weight change. Negative for fever.  HENT: Negative for ear pain, nosebleeds, congestion, sore throat, rhinorrhea, sneezing, trouble swallowing, dental problem, postnasal drip and sinus pressure.   Eyes: Negative for redness and itching.  Respiratory: Positive for cough and shortness of breath. Negative for chest tightness and wheezing.   Cardiovascular: Negative for palpitations and leg swelling.  Gastrointestinal: Negative for nausea and vomiting.  Genitourinary: Negative for dysuria.  Musculoskeletal: Negative for joint swelling.  Skin: Negative for rash.  Neurological: Negative for headaches.  Hematological: Does not bruise/bleed easily.  Psychiatric/Behavioral: Negative for dysphoric mood. The patient is not nervous/anxious.        Objective:   Physical Exam amb hoarse pleasant wf nad Wt 172  11/28/2011 HEENT: nl dentition, turbinates, and orophanx. Nl external ear canals without cough reflex   NECK :  without JVD/Nodes/TM/ nl carotid upstrokes bilaterally   LUNGS: no acc muscle use, clear to A and P bilaterally without cough on insp or exp maneuvers   CV:  RRR  no s3 or murmur or increase in P2, no edema   ABD:  soft and nontender with nl excursion in the supine position. No bruits or organomegaly, bowel sounds nl  MS:  warm without deformities, calf tenderness, cyanosis or clubbing  SKIN: warm and dry without lesions    NEURO:  alert, approp, no deficits       Assessment & Plan:

## 2011-11-28 NOTE — Assessment & Plan Note (Addendum)
The most common causes of chronic cough in immunocompetent adults include the following: upper airway cough syndrome (UACS), previously referred to as postnasal drip syndrome (PNDS), which is caused by variety of rhinosinus conditions; (2) asthma; (3) GERD; (4) chronic bronchitis from cigarette smoking or other inhaled environmental irritants; (5) nonasthmatic eosinophilic bronchitis; and (6) bronchiectasis.   These conditions, singly or in combination, have accounted for up to 94% of the causes of chronic cough in prospective studies.   Other conditions have constituted no >6% of the causes in prospective studies These have included bronchogenic carcinoma, chronic interstitial pneumonia, sarcoidosis, left ventricular failure, ACEI-induced cough, and aspiration from a condition associated with pharyngeal dysfunction.   .Chronic cough is often simultaneously caused by more than one condition. A single cause has been found from 38 to 82% of the time, multiple causes from 18 to 62%. Multiply caused cough has been the result of three diseases up to 42% of the time.   Most likely this is a form of  Classic Upper airway cough syndrome, so named because it's frequently impossible to sort out how much is  CR/sinusitis with freq throat clearing (which can be related to primary GERD)   vs  causing  secondary (" extra esophageal")  GERD from wide swings in gastric pressure that occur with throat clearing, often  promoting self use of mint and menthol lozenges that reduce the lower esophageal sphincter tone and exacerbate the problem further in a cyclical fashion.   These are the same pts (now being labeled as having "irritable larynx syndrome" by some cough centers) who not infrequently have a history of having failed to tolerate ace inhibitors(as was the case here, and they probably saved her life by leading her to have a cxr which detected her lung nodule),   dry powder inhalers or biphosphonates or report having  atypical reflux symptoms that don't respond to standard doses of PPI , and are easily confused as having aecopd or asthma flares by even experienced allergists/ pulmonologists.   For now rec max rx for GERD then regroup if not completely resolved.

## 2011-11-28 NOTE — Assessment & Plan Note (Signed)
Intolerance of acei strongly points to upper airway cough syndrome    By themselves ace inhibitors  don't actually cause a problem, much like oxygen can't by itself start a fire, but they certainly serve as a powerful catalyst or enhancer for any "fire"  or inflammatory process in the upper airway, be it caused by an ET  tube or more commonly reflux (especially in the obese or pts with known GERD or who are on biphoshonates, which may be the case here  I would therefore avoid acei indefinitely.

## 2011-11-28 NOTE — Assessment & Plan Note (Signed)
If improves with rx of gerd would rec consideration for reclast IV yearly if continued biphosphonates indicated

## 2011-12-03 ENCOUNTER — Telehealth: Payer: Self-pay | Admitting: Internal Medicine

## 2011-12-03 NOTE — Telephone Encounter (Signed)
Referred by Dr. Burney Dx- Lung Ca 

## 2011-12-09 ENCOUNTER — Encounter: Payer: Self-pay | Admitting: Internal Medicine

## 2011-12-09 ENCOUNTER — Ambulatory Visit (HOSPITAL_BASED_OUTPATIENT_CLINIC_OR_DEPARTMENT_OTHER): Payer: Medicare Other | Admitting: Internal Medicine

## 2011-12-09 ENCOUNTER — Telehealth: Payer: Self-pay | Admitting: Internal Medicine

## 2011-12-09 ENCOUNTER — Ambulatory Visit: Payer: Medicare Other

## 2011-12-09 ENCOUNTER — Other Ambulatory Visit (HOSPITAL_BASED_OUTPATIENT_CLINIC_OR_DEPARTMENT_OTHER): Payer: Medicare Other | Admitting: Lab

## 2011-12-09 DIAGNOSIS — R071 Chest pain on breathing: Secondary | ICD-10-CM

## 2011-12-09 DIAGNOSIS — C3490 Malignant neoplasm of unspecified part of unspecified bronchus or lung: Secondary | ICD-10-CM

## 2011-12-09 DIAGNOSIS — R0989 Other specified symptoms and signs involving the circulatory and respiratory systems: Secondary | ICD-10-CM

## 2011-12-09 DIAGNOSIS — R0609 Other forms of dyspnea: Secondary | ICD-10-CM

## 2011-12-09 DIAGNOSIS — C3410 Malignant neoplasm of upper lobe, unspecified bronchus or lung: Secondary | ICD-10-CM

## 2011-12-09 LAB — COMPREHENSIVE METABOLIC PANEL
ALT: 17 U/L (ref 0–35)
CO2: 31 mEq/L (ref 19–32)
Calcium: 9.7 mg/dL (ref 8.4–10.5)
Chloride: 95 mEq/L — ABNORMAL LOW (ref 96–112)
Creatinine, Ser: 0.91 mg/dL (ref 0.50–1.10)
Sodium: 135 mEq/L (ref 135–145)
Total Protein: 6.5 g/dL (ref 6.0–8.3)

## 2011-12-09 LAB — CBC WITH DIFFERENTIAL/PLATELET
Eosinophils Absolute: 0.2 10*3/uL (ref 0.0–0.5)
HGB: 13 g/dL (ref 11.6–15.9)
LYMPH%: 39.2 % (ref 14.0–49.7)
MONO#: 0.8 10*3/uL (ref 0.1–0.9)
NEUT#: 2.3 10*3/uL (ref 1.5–6.5)
Platelets: 310 10*3/uL (ref 145–400)
RBC: 4.29 10*6/uL (ref 3.70–5.45)
WBC: 5.5 10*3/uL (ref 3.9–10.3)
nRBC: 0 % (ref 0–0)

## 2011-12-09 NOTE — Telephone Encounter (Signed)
gv pt appt schedule for nov including ct scan for 11/25.

## 2011-12-09 NOTE — Progress Notes (Signed)
Patient came in today as a new patient and she has one insurance.I did explain to her the co-pay assistance program we offer here at the cancer center for medication and chemo drugs,she said that she is going to talk with the doctor first and she would call us if she need any assistance.I gave her Terald Sleeper Card if she has any question.

## 2011-12-09 NOTE — Progress Notes (Signed)
[facility] [phone]   [phone]) [phone]  CONSULT NOTE  REASON FOR CONSULTATION:  66 years old white female diagnosed with lung cancer.  HPI Tiffany Mcneil is a 66 y.o. female with past medical history significant for hypertension, dyslipidemia, osteoporosis, GERD and allergic rhinitis. The patient mentions that in January of 2013 she was started by her primary care physician on blood pressure medication. She started having a lot of dry cough and her blood pressure medication was changed. Chest x-ray performed at that time showed a nodule in the right upper lobe. This was followed by CT scan of the chest on 09/29/2011 and it showed an 9 x 12  x 14 mm irregularly marginated noncalcified lesion in the right upper lobe. This was followed by a PET scan on 10/09/2011 which showed Stable 12 mm subsolid nodule in the right upper lobe, as described above, max SUV 1.3. Despite the lack of hypermetabolism, the appearance remains suspicious for minimally invasive adenocarcinoma. The patient was referred to Dr. Edwyna Shell and on 10/22/2011 she underwent right VATS, resection of right upper lobe with node sampling. The final pathology showed invasive well-differentiated adenocarcinoma arising in a background of her adenocarcinoma in situ, measuring 1.3 CM confined within the lung parenchyma with no evidence of angiolymphatic invasion or visceral pleural involvement. 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 The dissected lymph nodes were negative for malignancy. The tissue blocks were sent for EGFR mutation as well as ALK gene translocation. 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 The ALK gene translocation was also negative. Dr. Edwyna Shell kindly referred the patient to me today for evaluation and recommendation regarding  adjuvant therapy for her lung cancer.  When seen today the patient is feeling fine except for soreness in the right side of the chest at the surgical scar site. She also has shortness breath with exertion and mild dry cough, or hemoptysis. He has no significant weight loss or night sweats. The patient is single and has 2 children, one lives in [place] and the other one is a Psychologist, educational in [place]. 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 She has a history of smoking for around 15 years but quit 28 years ago. She also has 3 drinks of alcohol every night but no history of drug abuse.  @SFHPI @  Past Medical History  Diagnosis Date  . Lung nodule   . Hyperlipidemia   . Osteoporosis   . Lipoma   . Hypertension   . Allergic rhinitis   . GERD (gastroesophageal reflux disease)   . Ascending aorta dilation     up to 4.3 cm by 08/13/11 echo    Past Surgical History  Procedure Date  . Wrist surgery     right  . Appendectomy   . Vaginal hysterectomy 1993  . Eye surgery     lasik sx, "lazy eye sx"  . Lung surgery 10/22/11    removal lymph nodes and nodules     Family History  Problem Relation Age of Onset  . Diabetes Mother   . Stroke Mother   . Heart disease Father     Social History History  Substance Use Topics  . Smoking status: Former Smoker -- 3.0 packs/day for 15 years  Types: Cigarettes    Quit date: 07/14/1983  . Smokeless tobacco: Never Used  . Alcohol Use: Yes     beer/wine/vodka--daily   3 drinks nightly    Allergies  Allergen Reactions  . Lisinopril Cough    Current Outpatient Prescriptions  Medication Sig Dispense Refill  . amLODipine (NORVASC) 5 MG tablet Take 1 tablet by mouth Daily.      Marland Kitchen atorvastatin (LIPITOR) 20 MG tablet Take 20 mg by mouth at bedtime.       Marland Kitchen esomeprazole (NEXIUM) 40 MG capsule Take 40 mg by mouth daily before  breakfast.      . HYDROcodone-homatropine (HYCODAN) 5-1.5 MG/5ML syrup Take by mouth. Take 1 tsp by mouth every night and during the day as needed for cough.      . iron polysaccharides (NIFEREX) 150 MG capsule Take 150 mg by mouth every other day.      . mometasone (NASONEX) 50 MCG/ACT nasal spray Place 2 sprays into the nose daily as needed. For allergies      . Multiple Vitamins-Minerals (MULTIVITAL) tablet Take 1 tablet by mouth daily.      . nitroGLYCERIN (NITROSTAT) 0.4 MG SL tablet Place 0.4 mg under the tongue every 5 (five) minutes as needed. For chest pain      . valsartan-hydrochlorothiazide (DIOVAN-HCT) 160-12.5 MG per tablet Take 1 tablet by mouth Daily.        Review of Systems  A comprehensive review of systems was negative except for: Respiratory: positive for dyspnea on exertion and pleurisy/chest pain  Physical Exam  ZOX:WRUEA, healthy, no distress, well nourished and well developed SKIN: skin color, texture, turgor are normal HEAD: Normocephalic, No masses, lesions, tenderness or abnormalities EYES: normal EARS: External ears normal OROPHARYNX:no exudate and no erythema  NECK: supple, no adenopathy LYMPH:  no palpable lymphadenopathy, no hepatosplenomegaly BREAST:not examined LUNGS: clear to auscultation  HEART: regular rate & rhythm, no murmurs and no gallops ABDOMEN:abdomen soft, non-tender, normal bowel sounds and no masses or organomegaly BACK: Back symmetric, no curvature. EXTREMITIES:no joint deformities, effusion, or inflammation, no edema, no skin discoloration, no clubbing, no cyanosis  NEURO: alert & oriented x 3 with fluent speech, no focal motor/sensory deficits, gait normal  PERFORMANCE STATUS: ECOG 0  Studies/Results: Dg Chest 2 View  11/25/2011  *RADIOLOGY REPORT*  Clinical Data: Prior right VATS with resection of the right upper lobe for right upper lobe lung nodule  CHEST - 2 VIEW  Comparison: Chest x-ray of 11/03/2011 .  Findings: The lungs are  clear.  Mediastinal contours are stable. The heart is within normal limits in size.  No bony abnormality is seen.  IMPRESSION: No active lung disease.  Original Report Authenticated By: Juline Patch, M.D.     ASSESSMENT: This is a very pleasant 66 years old white female recently diagnosed with a stage IA (T1a., N0, M0) non-small cell lung cancer consistent with adenocarcinoma with positive EGFR mutation in exon 20 which is resistant to treatment with Tarceva and negative ALK gene translocation.  PLAN: I have a lengthy discussion with the patient today about her disease stage, prognosis and treatment options. I explained to the patient that the median overall 5 year survival for patient with stage IA non-small cell lung cancer is around 80%. I also explained to her that there is no benefit for treatment with adjuvant chemotherapy for patient with stage IA non-small cell lung cancer and the current standard of care is observation. I also explained to the patient that  she has positive EGFR mutation but in exon 20 which usually carry resistant to treatment with a tyrosine kinase inhibitor like Tarceva if she has evidence for disease recurrence in the future. I would see the patient back for followup visit in 6 months with repeat CT scan of the chest with contrast. She was advised to call me immediately if she has any concerning symptoms in the interval.  All questions were answered. The patient knows to call the clinic with any problems, questions or concerns. We can certainly see the patient much sooner if necessary.  Thank you so much for allowing me to participate in the care of Tiffany Mcneil. I will continue to follow up the patient with you and assist in her care.  I spent 30 minutes counseling the patient face to face. The total time spent in the appointment was 60 minutes.  CODE STATUS: Full code.  Tiffine Henigan K. 12/09/2011, 2:45 PM

## 2011-12-11 ENCOUNTER — Ambulatory Visit: Payer: Medicare Other | Admitting: Adult Health

## 2011-12-16 ENCOUNTER — Other Ambulatory Visit: Payer: Self-pay | Admitting: Thoracic Surgery

## 2011-12-16 DIAGNOSIS — D381 Neoplasm of uncertain behavior of trachea, bronchus and lung: Secondary | ICD-10-CM

## 2011-12-23 ENCOUNTER — Ambulatory Visit: Payer: Self-pay | Admitting: Thoracic Surgery

## 2011-12-23 ENCOUNTER — Ambulatory Visit
Admission: RE | Admit: 2011-12-23 | Discharge: 2011-12-23 | Disposition: A | Discharge status: Home / Self Care | Payer: Medicare Other | Source: Ambulatory Visit | Attending: Thoracic Surgery | Admitting: Thoracic Surgery

## 2011-12-23 ENCOUNTER — Ambulatory Visit (INDEPENDENT_AMBULATORY_CARE_PROVIDER_SITE_OTHER): Payer: Self-pay | Admitting: Thoracic Surgery

## 2011-12-23 ENCOUNTER — Encounter: Payer: Self-pay | Admitting: Thoracic Surgery

## 2011-12-23 VITALS — BP 123/73 | HR 96 | Resp 20 | Ht 68.0 in | Wt 168.0 lb

## 2011-12-23 DIAGNOSIS — Z09 Encounter for follow-up examination after completed treatment for conditions other than malignant neoplasm: Secondary | ICD-10-CM

## 2011-12-23 DIAGNOSIS — C3490 Malignant neoplasm of unspecified part of unspecified bronchus or lung: Secondary | ICD-10-CM

## 2011-12-23 DIAGNOSIS — D381 Neoplasm of uncertain behavior of trachea, bronchus and lung: Secondary | ICD-10-CM

## 2011-12-23 NOTE — Progress Notes (Signed)
HPI unwell. Incisions are well-healed. She is now 3 months since her surgery. She will be followed by Dr. Gwenyth Bouillon. Chest x-ray showed normal postoperative changes. Her lung problem is continuation of her cough. We gave her a prescription for Tessalon pearls 100 mg #92 take 3 times a day. She knows that time retiring and. She will see [place] again as needed   Current Outpatient Prescriptions  Medication Sig Dispense Refill  . amLODipine (NORVASC) 5 MG tablet Take 1 tablet by mouth Daily.      Marland Kitchen atorvastatin (LIPITOR) 20 MG tablet Take 20 mg by mouth at bedtime.       Marland Kitchen esomeprazole (NEXIUM) 40 MG capsule Take 40 mg by mouth daily before breakfast.      . iron polysaccharides (NIFEREX) 150 MG capsule Take 150 mg by mouth every other day.      . mometasone (NASONEX) 50 MCG/ACT nasal spray Place 2 sprays into the nose daily as needed. For allergies      . Multiple Vitamins-Minerals (MULTIVITAL) tablet Take 1 tablet by mouth daily.      . nitroGLYCERIN (NITROSTAT) 0.4 MG SL tablet Place 0.4 mg under the tongue every 5 (five) minutes as needed. For chest pain      . valsartan-hydrochlorothiazide (DIOVAN-HCT) 160-12.5 MG per tablet Take 1 tablet by mouth Daily.         Review of Systems: persistent cough   Physical Exam lungs are clear to auscultation percussion   Diagnostic Tests: Chest x-ray showed normal postoperative changes   Impression: Stage IA non-small cell lung cancer left upper lobe. Followup by Dr. Gwenyth Bouillon.   Plan:

## 2011-12-24 ENCOUNTER — Ambulatory Visit: Payer: Medicare Other | Admitting: Adult Health

## 2012-02-08 ENCOUNTER — Ambulatory Visit
Admission: RE | Admit: 2012-02-08 | Discharge: 2012-02-08 | Disposition: A | Discharge status: Home / Self Care | Payer: Medicare Other | Source: Ambulatory Visit | Attending: Interventional Cardiology | Admitting: Interventional Cardiology

## 2012-02-08 DIAGNOSIS — R0789 Other chest pain: Secondary | ICD-10-CM

## 2012-02-08 MED ORDER — GADOBENATE DIMEGLUMINE 529 MG/ML IV SOLN
16.0000 mL | Freq: Once | INTRAVENOUS | Status: AC | PRN
  Administered 2012-02-08: 16 mL via INTRAVENOUS

## 2012-05-30 ENCOUNTER — Encounter (INDEPENDENT_AMBULATORY_CARE_PROVIDER_SITE_OTHER): Payer: Self-pay | Admitting: General Surgery

## 2012-06-06 ENCOUNTER — Other Ambulatory Visit (HOSPITAL_BASED_OUTPATIENT_CLINIC_OR_DEPARTMENT_OTHER): Payer: Medicare Other | Admitting: Lab

## 2012-06-06 ENCOUNTER — Ambulatory Visit (HOSPITAL_COMMUNITY)
Admission: RE | Admit: 2012-06-06 | Discharge: 2012-06-06 | Disposition: A | Discharge status: Home / Self Care | Payer: Medicare Other | Source: Ambulatory Visit | Attending: Internal Medicine | Admitting: Internal Medicine

## 2012-06-06 DIAGNOSIS — C3410 Malignant neoplasm of upper lobe, unspecified bronchus or lung: Secondary | ICD-10-CM | POA: Insufficient documentation

## 2012-06-06 LAB — CBC WITH DIFFERENTIAL/PLATELET
Basophils Absolute: 0.1 10*3/uL (ref 0.0–0.1)
EOS%: 3.7 % (ref 0.0–7.0)
Eosinophils Absolute: 0.2 10*3/uL (ref 0.0–0.5)
HGB: 14 g/dL (ref 11.6–15.9)
LYMPH%: 30.5 % (ref 14.0–49.7)
MCH: 30 pg (ref 25.1–34.0)
MCV: 88.5 fL (ref 79.5–101.0)
MONO%: 9.6 % (ref 0.0–14.0)
NEUT#: 3.3 10*3/uL (ref 1.5–6.5)
Platelets: 277 10*3/uL (ref 145–400)
RBC: 4.67 10*6/uL (ref 3.70–5.45)

## 2012-06-06 LAB — COMPREHENSIVE METABOLIC PANEL (CC13)
AST: 17 U/L (ref 5–34)
Alkaline Phosphatase: 111 U/L (ref 40–150)
Glucose: 96 mg/dl (ref 70–99)
Potassium: 3.7 mEq/L (ref 3.5–5.1)
Sodium: 136 mEq/L (ref 136–145)
Total Bilirubin: 0.73 mg/dL (ref 0.20–1.20)
Total Protein: 7.5 g/dL (ref 6.4–8.3)

## 2012-06-06 MED ORDER — IOHEXOL 300 MG/ML  SOLN
80.0000 mL | Freq: Once | INTRAMUSCULAR | Status: AC | PRN
  Administered 2012-06-06: 80 mL via INTRAVENOUS

## 2012-06-07 ENCOUNTER — Ambulatory Visit (INDEPENDENT_AMBULATORY_CARE_PROVIDER_SITE_OTHER): Payer: Self-pay | Admitting: General Surgery

## 2012-06-08 ENCOUNTER — Telehealth: Payer: Self-pay | Admitting: Internal Medicine

## 2012-06-08 ENCOUNTER — Ambulatory Visit (HOSPITAL_BASED_OUTPATIENT_CLINIC_OR_DEPARTMENT_OTHER): Payer: Medicare Other | Admitting: Internal Medicine

## 2012-06-08 ENCOUNTER — Encounter: Payer: Self-pay | Admitting: *Deleted

## 2012-06-08 VITALS — BP 114/76 | HR 87 | Temp 97.0°F | Resp 20 | Ht 68.0 in | Wt 178.4 lb

## 2012-06-08 DIAGNOSIS — C3410 Malignant neoplasm of upper lobe, unspecified bronchus or lung: Secondary | ICD-10-CM

## 2012-06-08 DIAGNOSIS — R911 Solitary pulmonary nodule: Secondary | ICD-10-CM

## 2012-06-08 DIAGNOSIS — C3490 Malignant neoplasm of unspecified part of unspecified bronchus or lung: Secondary | ICD-10-CM | POA: Insufficient documentation

## 2012-06-08 NOTE — Progress Notes (Signed)
Spoke with pt at [facility] today. No questions or concerns at this time.  

## 2012-06-08 NOTE — Telephone Encounter (Signed)
gv pt appt schedule for May 2014. Pt aware she will be contacted re scan for May 2014.

## 2012-06-08 NOTE — Progress Notes (Signed)
[facility] Telephone:([phone]) [phone]   [phone]  OFFICE PROGRESS NOTE  Tiffany Patella, MD [facility] 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   PRIOR THERAPY: Status post right upper lobectomy with lymph node dissection on 10/22/2011.  CURRENT THERAPY: Observation.  INTERVAL HISTORY: Tiffany Mcneil 66 y.o. female returns to the clinic today for routine six-month followup visit. The patient is feeling fine today with no specific complaints. She denied having any significant weight loss or night sweats. She denied having any chest pain, shortness breath, cough or hemoptysis. The patient had a recent CT scan of the chest performed and she is here for evaluation and discussion of her scan results.  MEDICAL HISTORY: Past Medical History  Diagnosis Date  . Lung nodule   . Hyperlipidemia   . Osteoporosis   . Lipoma   . Hypertension   . Allergic rhinitis   . GERD (gastroesophageal reflux disease)   . Ascending aorta dilation     up to 4.3 cm by 08/13/11 echo    ALLERGIES:  is allergic to lisinopril.  MEDICATIONS:  Current Outpatient Prescriptions  Medication Sig Dispense Refill  . amLODipine (NORVASC) 5 MG tablet Take 1 tablet by mouth Daily.      Marland Kitchen aspirin 81 MG tablet Take 81 mg by mouth daily.      Marland Kitchen atorvastatin (LIPITOR) 20 MG tablet Take 40 mg by mouth at bedtime.       . Glucosamine-Chondroit-Vit C-Mn (GLUCOSAMINE 1500 COMPLEX PO) Take 2 tablets by mouth daily.      Marland Kitchen loratadine (CLARITIN) 10 MG tablet Take 10 mg by mouth daily.      . Multiple Vitamins-Minerals (MULTIVITAL) tablet Take 1 tablet by mouth daily.      Marland Kitchen omeprazole (PRILOSEC) 20 MG capsule Take 20 mg by mouth daily.      .  hydrochlorothiazide (MICROZIDE) 12.5 MG capsule Take 12.5 mg by mouth Daily.      . nitroGLYCERIN (NITROSTAT) 0.4 MG SL tablet Place 0.4 mg under the tongue every 5 (five) minutes as needed. For chest pain        SURGICAL HISTORY:  Past Surgical History  Procedure Date  . Wrist surgery     right  . Appendectomy   . Vaginal hysterectomy 1993  . Eye surgery     lasik sx, "lazy eye sx"  . Lung surgery 10/22/11    removal lymph nodes and nodules     REVIEW OF SYSTEMS:  A comprehensive review of systems was negative.   PHYSICAL EXAMINATION: General appearance: alert, cooperative and no distress Head: Normocephalic, without obvious abnormality, atraumatic Neck: no adenopathy Resp: clear to auscultation bilaterally Cardio: regular rate and rhythm, S1, S2 normal, no murmur, click, rub or gallop GI: soft, non-tender; bowel sounds normal; no masses,  no organomegaly Extremities: extremities normal, atraumatic, no cyanosis or edema  ECOG PERFORMANCE STATUS: 0 - Asymptomatic  Blood pressure 114/76, pulse 87, temperature 97 F (36.1 C), temperature source Oral, resp. rate 20, height 5\' 8"  (1.727 m), weight 178 lb 6.4 oz (80.922 kg).  LABORATORY DATA: Lab Results  Component Value Date   WBC 6.1 06/06/2012   HGB 14.0 06/06/2012   HCT 41.3 06/06/2012   MCV 88.5 06/06/2012   PLT 277  06/06/2012      Chemistry      Component Value Date/Time   NA 136 06/06/2012 1101   NA 135 12/09/2011 1258   K 3.7 06/06/2012 1101   K 4.1 12/09/2011 1258   CL 97* 06/06/2012 1101   CL 95* 12/09/2011 1258   CO2 31* 06/06/2012 1101   CO2 31 12/09/2011 1258   BUN 13.0 06/06/2012 1101   BUN 14 12/09/2011 1258   CREATININE 0.9 06/06/2012 1101   CREATININE 0.91 12/09/2011 1258      Component Value Date/Time   CALCIUM 9.8 06/06/2012 1101   CALCIUM 9.7 12/09/2011 1258   ALKPHOS 111 06/06/2012 1101   ALKPHOS 74 12/09/2011 1258   AST 17 06/06/2012 1101   AST 18 12/09/2011 1258   ALT 16 06/06/2012 1101   ALT  17 12/09/2011 1258   BILITOT 0.73 06/06/2012 1101   BILITOT 0.9 12/09/2011 1258       RADIOGRAPHIC STUDIES: Ct Chest W Contrast  06/06/2012  *RADIOLOGY REPORT*  Clinical Data: Lung cancer diagnosed 2013.  Surgical therapy.  CT CHEST WITH CONTRAST  Technique:  Multidetector CT imaging of the chest was performed following the standard protocol during bolus administration of intravenous contrast.  Contrast: 80mL OMNIPAQUE IOHEXOL 300 MG/ML  SOLN  Comparison: PET CT scan 10/09/2011  Findings: Post surgical change in the right upper lobe consistent with lobectomy.  No new nodularity present.  There is a ground-glass nodule in the lingula measuring 6 mm which is new from prior.  More ill-defined curved ground-glass opacity in the left upper lobe (image 18) is unchanged.  No axillary or supraclavicular lymphadenopathy.  No mediastinal or hilar lymphadenopathy.  No pericardial fluid.  Adrenal glands are normal.  Limited view of the liver is unremarkable.  No aggressive osseous lesions.  IMPRESSION:  1.  Stable postoperative change in the right upper lobe. 2.  New ground-glass nodule in the lingula measures 6 mm. Attention on follow-up. 3.  Stable ground-glass opacity in the left upper lobe.   Original Report Authenticated By: Genevive Bi, M.D.     ASSESSMENT: This is a very pleasant 66 years old white female with history of stage IA non-small cell lung cancer status post right upper lobectomy with lymph node dissection and has been observation since April 2013 was no evidence for disease recurrence except for new groundglass nodule in the lingula that will need close attention on the upcoming scan.  PLAN: I discussed the scan results with the patient today. I recommended for her to continue on observation for now with repeat CT scan of the chest in 6 months. She was advised to call me immediately if she has any concerning symptoms in the interval.  All questions were answered. The patient knows to call  the clinic with any problems, questions or concerns. We can certainly see the patient much sooner if necessary.

## 2012-06-10 NOTE — Patient Instructions (Signed)
The scan showed no clear evidence for disease recurrence. Followup visit with repeat CT scan in 6 months

## 2012-06-12 IMAGING — CR DG CHEST 2V
2 series · 2 of 2 positions shown · non-contrast
Comparison: Chest x-ray of 11/03/2011 .

CLINICAL DATA: Prior right VATS with resection of the right upper
lobe for right upper lobe lung nodule

CHEST - 2 VIEW

[w chest pa]
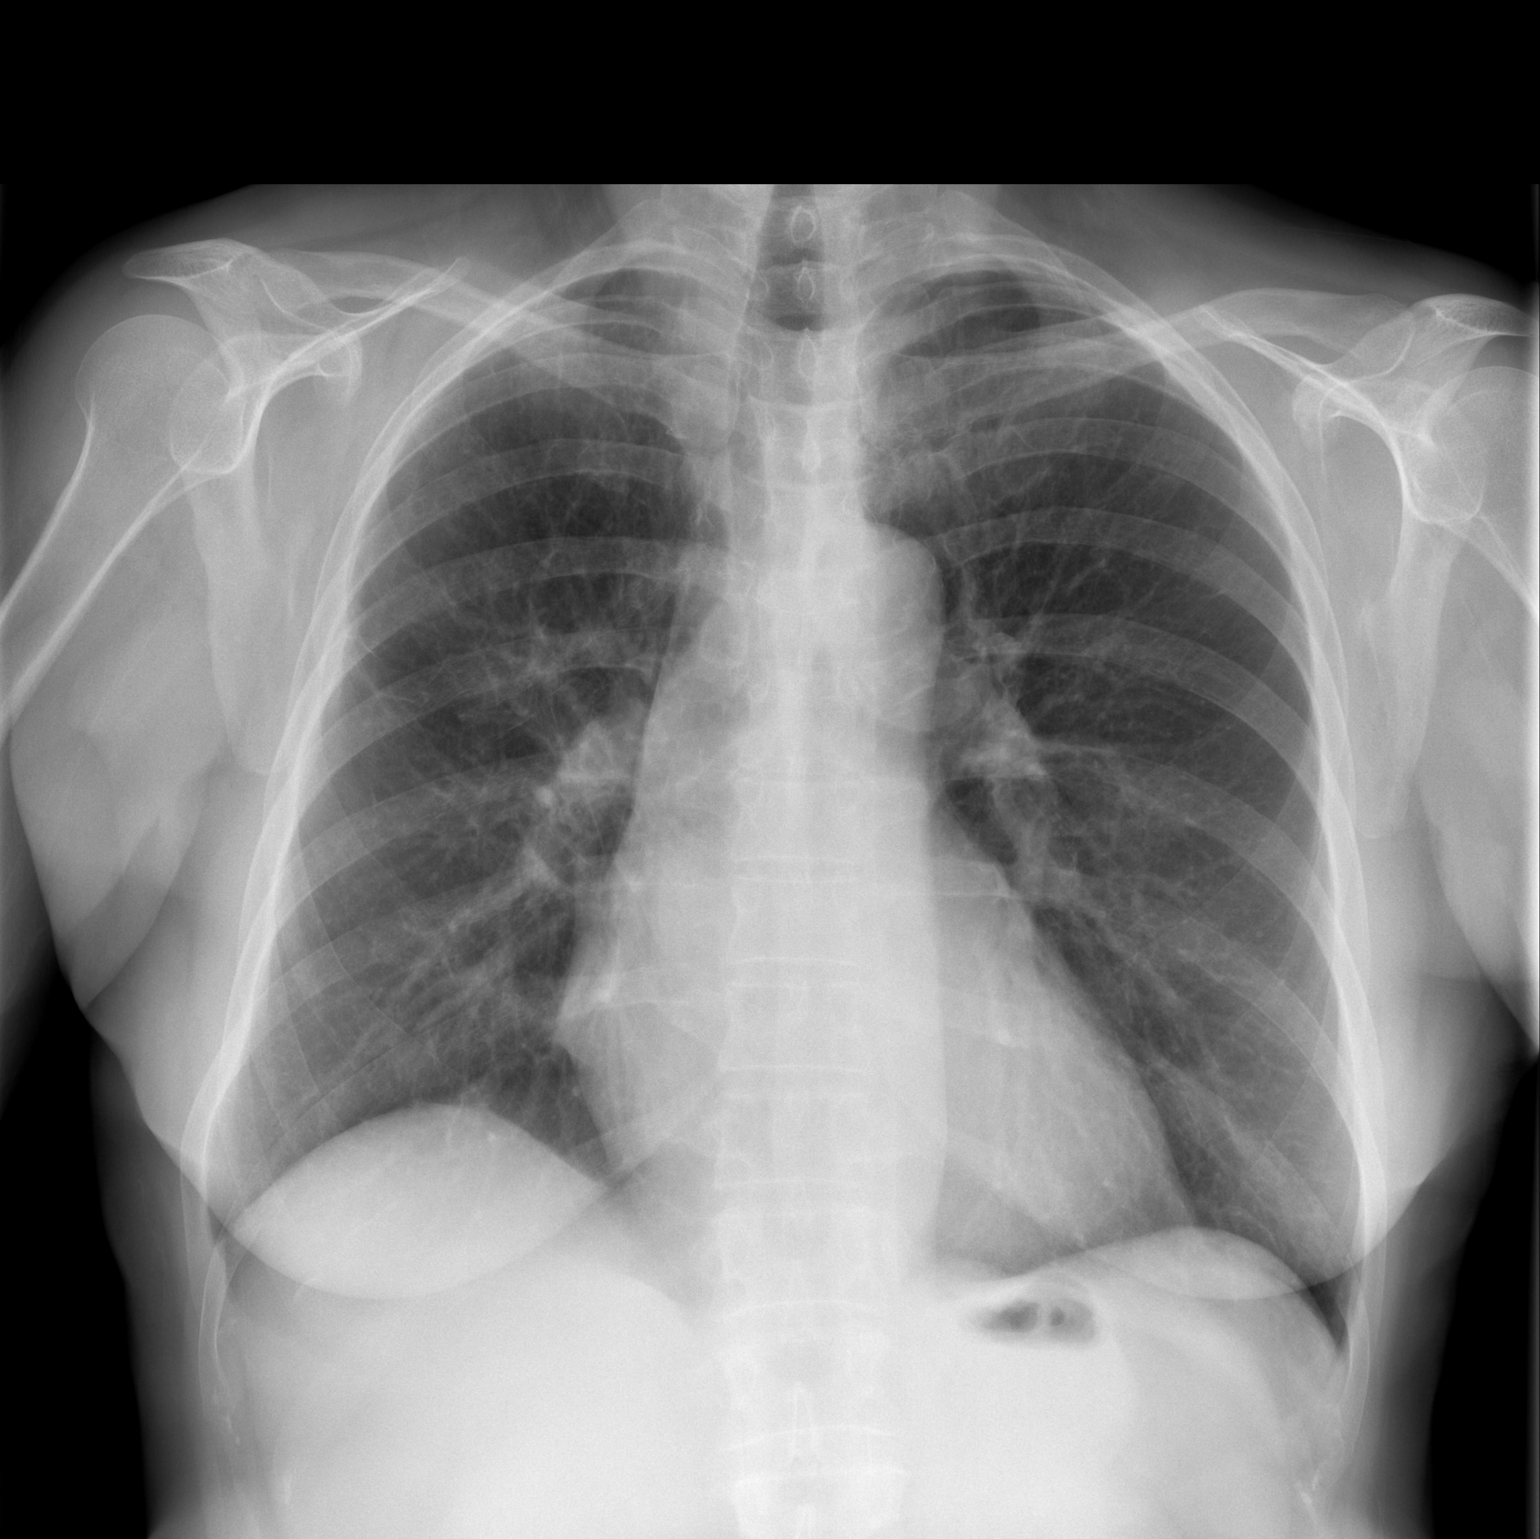

[w chest lat]
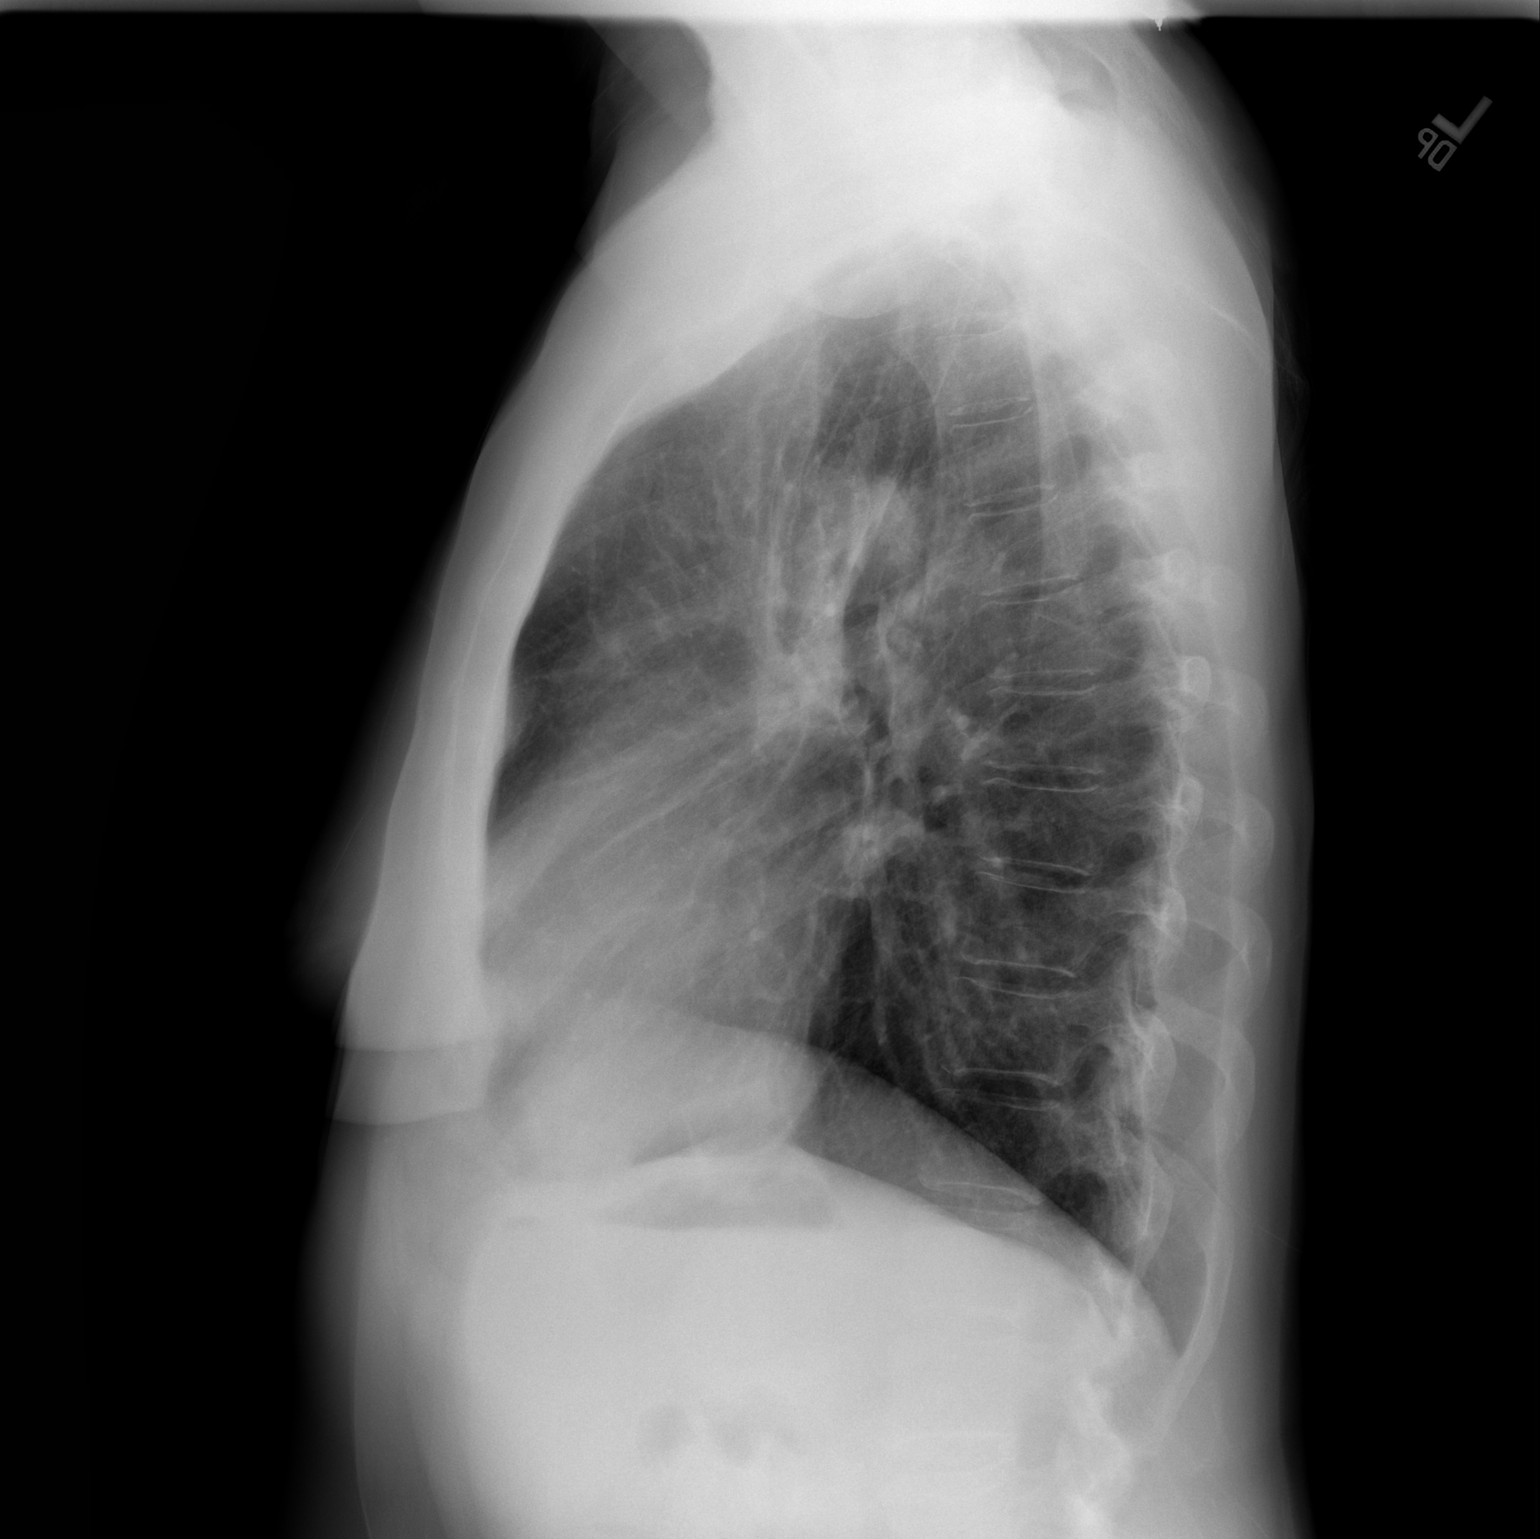

[2 of 2 positions shown; findings below may reference images not displayed]

FINDINGS: The lungs are clear.  Mediastinal contours are stable.
The heart is within normal limits in size.  No bony abnormality is
seen.
IMPRESSION: No active lung disease.

## 2012-07-04 ENCOUNTER — Encounter (INDEPENDENT_AMBULATORY_CARE_PROVIDER_SITE_OTHER): Payer: Self-pay | Admitting: General Surgery

## 2012-07-04 ENCOUNTER — Ambulatory Visit (INDEPENDENT_AMBULATORY_CARE_PROVIDER_SITE_OTHER): Payer: Medicare Other | Admitting: General Surgery

## 2012-07-04 VITALS — BP 126/86 | HR 82 | Temp 97.9°F | Ht 68.0 in | Wt 175.4 lb

## 2012-07-04 DIAGNOSIS — D179 Benign lipomatous neoplasm, unspecified: Secondary | ICD-10-CM

## 2012-07-04 NOTE — Progress Notes (Signed)
Subjective:     Patient ID: Tiffany Mcneil, female   DOB: 1945-09-24, 66 y.o.   MRN: [id number]  HPI We are asked to see the patient in consultation by Dr. Herbert Moors to evaluate her for a lipoma on her back. The patient is a 66 her white female who has had these lumps on her back since the 1980s. There are times when they get larger and smaller. She has started to work out recently and they have become more uncomfortable for her. She denies any fevers or chills. She denies any chest pain or shortness of breath. She did have an episode of chest pain back in January but was evaluated by the cardiologist for this. By her report workup was negative.  Review of Systems  Constitutional: Negative.   HENT: Negative.   Eyes: Negative.   Respiratory: Negative.   Cardiovascular: Negative.   Gastrointestinal: Negative.   Genitourinary: Negative.   Musculoskeletal: Negative.   Skin: Negative.   Neurological: Negative.   Hematological: Negative.   Psychiatric/Behavioral: Negative.        Objective:   Physical Exam  Constitutional: She is oriented to person, place, and time. She appears well-developed and well-nourished.  HENT:  Head: Normocephalic and atraumatic.  Eyes: Conjunctivae normal and EOM are normal. Pupils are equal, round, and reactive to light.  Neck: Normal range of motion. Neck supple.  Cardiovascular: Normal rate, regular rhythm and normal heart sounds.   Pulmonary/Chest: Effort normal and breath sounds normal.       On her right upper back she has 2 palpable masses. They are both very fatty in nature consistent with lipomas. One measures approximately 3-4 cm in diameter and the other just slightly larger than this.  Abdominal: Soft. Bowel sounds are normal.  Musculoskeletal: Normal range of motion.  Neurological: She is alert and oriented to person, place, and time.  Skin: Skin is warm and dry.  Psychiatric: She has a normal mood and affect. Her behavior is normal.        Assessment:     The patient appears to have 2 moderate sized lipomas on her right upper back. Because they are symptomatic I think it would be reasonable to remove them. She would also like to have this done. I've discussed thoroughly detail the risks and benefits of the operation to remove the lipomas as well as some of the technical aspects and she understands and wishes to proceed    Plan:     Plan for excision of 2 lipomas from her right upper back

## 2012-07-04 NOTE — Patient Instructions (Signed)
Plan to remove two lipomas from right upper back

## 2012-07-14 ENCOUNTER — Other Ambulatory Visit: Payer: Self-pay | Admitting: Gastroenterology

## 2012-12-07 ENCOUNTER — Ambulatory Visit (HOSPITAL_COMMUNITY)
Admission: RE | Admit: 2012-12-07 | Discharge: 2012-12-07 | Disposition: A | Discharge status: Home / Self Care | Payer: Medicare Other | Source: Ambulatory Visit | Attending: Internal Medicine | Admitting: Internal Medicine

## 2012-12-07 ENCOUNTER — Other Ambulatory Visit (HOSPITAL_BASED_OUTPATIENT_CLINIC_OR_DEPARTMENT_OTHER): Payer: Medicare Other | Admitting: Lab

## 2012-12-07 ENCOUNTER — Encounter (HOSPITAL_COMMUNITY): Payer: Self-pay

## 2012-12-07 DIAGNOSIS — R059 Cough, unspecified: Secondary | ICD-10-CM | POA: Insufficient documentation

## 2012-12-07 DIAGNOSIS — Z902 Acquired absence of lung [part of]: Secondary | ICD-10-CM | POA: Insufficient documentation

## 2012-12-07 DIAGNOSIS — Z87891 Personal history of nicotine dependence: Secondary | ICD-10-CM | POA: Insufficient documentation

## 2012-12-07 DIAGNOSIS — C3490 Malignant neoplasm of unspecified part of unspecified bronchus or lung: Secondary | ICD-10-CM

## 2012-12-07 DIAGNOSIS — R911 Solitary pulmonary nodule: Secondary | ICD-10-CM | POA: Insufficient documentation

## 2012-12-07 DIAGNOSIS — I700 Atherosclerosis of aorta: Secondary | ICD-10-CM | POA: Insufficient documentation

## 2012-12-07 DIAGNOSIS — I2510 Atherosclerotic heart disease of native coronary artery without angina pectoris: Secondary | ICD-10-CM | POA: Insufficient documentation

## 2012-12-07 DIAGNOSIS — C3410 Malignant neoplasm of upper lobe, unspecified bronchus or lung: Secondary | ICD-10-CM

## 2012-12-07 DIAGNOSIS — R05 Cough: Secondary | ICD-10-CM | POA: Insufficient documentation

## 2012-12-07 LAB — CBC WITH DIFFERENTIAL/PLATELET
EOS%: 2.5 % (ref 0.0–7.0)
LYMPH%: 33.2 % (ref 14.0–49.7)
MCH: 29.5 pg (ref 25.1–34.0)
MCHC: 33.8 g/dL (ref 31.5–36.0)
MCV: 87 fL (ref 79.5–101.0)
MONO%: 9.4 % (ref 0.0–14.0)
Platelets: 229 10*3/uL (ref 145–400)
RBC: 4.41 10*6/uL (ref 3.70–5.45)
RDW: 13.6 % (ref 11.2–14.5)

## 2012-12-07 LAB — COMPREHENSIVE METABOLIC PANEL (CC13)
ALT: 16 U/L (ref 0–55)
AST: 17 U/L (ref 5–34)
Albumin: 3.9 g/dL (ref 3.5–5.0)
Alkaline Phosphatase: 82 U/L (ref 40–150)
Glucose: 94 mg/dl (ref 70–99)
Potassium: 3.5 mEq/L (ref 3.5–5.1)
Sodium: 136 mEq/L (ref 136–145)
Total Bilirubin: 0.57 mg/dL (ref 0.20–1.20)
Total Protein: 6.9 g/dL (ref 6.4–8.3)

## 2012-12-07 MED ORDER — IOHEXOL 300 MG/ML  SOLN
80.0000 mL | Freq: Once | INTRAMUSCULAR | Status: AC | PRN
  Administered 2012-12-07: 80 mL via INTRAVENOUS

## 2012-12-08 ENCOUNTER — Telehealth: Payer: Self-pay | Admitting: Internal Medicine

## 2012-12-08 ENCOUNTER — Encounter: Payer: Self-pay | Admitting: Internal Medicine

## 2012-12-08 ENCOUNTER — Ambulatory Visit (HOSPITAL_BASED_OUTPATIENT_CLINIC_OR_DEPARTMENT_OTHER): Payer: Medicare Other | Admitting: Internal Medicine

## 2012-12-08 DIAGNOSIS — C3490 Malignant neoplasm of unspecified part of unspecified bronchus or lung: Secondary | ICD-10-CM

## 2012-12-08 NOTE — Patient Instructions (Signed)
No evidence for disease recurrence on the recent scan.  Followup visit in 6 months with repeat CT scan of the chest pain

## 2012-12-08 NOTE — Progress Notes (Signed)
[facility] [phone]   Fax:(336) [phone]  OFFICE PROGRESS NOTE  Tiffany Patella, MD [facility] [address] 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   PRIOR THERAPY: Status post right upper lobectomy with lymph node dissection on 10/22/2011.   CURRENT THERAPY: Observation.  INTERVAL HISTORY: Tiffany Mcneil 67 y.o. female returns to the clinic today for routine six-month followup visit. The patient is fine today with no specific complaints. She exercises at regular basis. She denied having any significant weight loss or night sweats. She has no chest pain, shortness breath, cough or hemoptysis. The patient had repeat CT scan of the chest performed recently and she is here for evaluation and discussion of her scan results.  MEDICAL HISTORY: Past Medical History  Diagnosis Date  . Lung nodule   . Hyperlipidemia   . Osteoporosis   . Lipoma   . Hypertension   . Allergic rhinitis   . GERD (gastroesophageal reflux disease)   . Ascending aorta dilation     up to 4.3 cm by 08/13/11 echo  . lung ca dx'd 10/22/11    surg only    ALLERGIES:  is allergic to lisinopril.  MEDICATIONS:  Current Outpatient Prescriptions  Medication Sig Dispense Refill  . amLODipine (NORVASC) 5 MG tablet Take 1 tablet by mouth Daily.      Marland Kitchen aspirin 81 MG tablet Take 81 mg by mouth daily.      Marland Kitchen atorvastatin (LIPITOR) 20 MG tablet Take 40 mg by mouth at bedtime.       . Glucosamine-Chondroit-Vit C-Mn (GLUCOSAMINE 1500 COMPLEX PO) Take 2 tablets by mouth daily.      . hydrochlorothiazide (MICROZIDE) 12.5 MG capsule Take 12.5 mg by mouth Daily.      Marland Kitchen loratadine (CLARITIN) 10 MG tablet Take 10 mg by mouth daily.      . Multiple  Vitamins-Minerals (MULTIVITAL) tablet Take 1 tablet by mouth daily.      Marland Kitchen omeprazole (PRILOSEC) 20 MG capsule Take 20 mg by mouth daily.      . nitroGLYCERIN (NITROSTAT) 0.4 MG SL tablet Place 0.4 mg under the tongue every 5 (five) minutes as needed. For chest pain       No current facility-administered medications for this visit.    SURGICAL HISTORY:  Past Surgical History  Procedure Laterality Date  . Wrist surgery      right  . Eye surgery      lasik sx, "lazy eye sx"  . Lung surgery  10/22/11    removal lymph nodes and nodules   . Appendectomy  1963  . Vaginal hysterectomy  1993    REVIEW OF SYSTEMS:  A comprehensive review of systems was negative.   PHYSICAL EXAMINATION: General appearance: alert, cooperative and no distress Head: Normocephalic, without obvious abnormality, atraumatic Neck: no adenopathy Lymph nodes: Cervical, supraclavicular, and axillary nodes normal. Resp: clear to auscultation bilaterally Cardio: regular rate and rhythm, S1, S2 normal, no murmur, click, rub or gallop GI: soft, non-tender; bowel sounds normal; no masses,  no organomegaly Extremities: extremities normal, atraumatic, no cyanosis or edema  ECOG PERFORMANCE STATUS: 0 - Asymptomatic  Blood pressure 106/72, pulse 81, temperature 98 F (36.7 C), temperature source Oral, resp. rate 18, height 5\' 8"  (1.727 m), weight 160  lb 4.8 oz (72.712 kg).  LABORATORY DATA: Lab Results  Component Value Date   WBC 5.7 12/07/2012   HGB 13.0 12/07/2012   HCT 38.4 12/07/2012   MCV 87.0 12/07/2012   PLT 229 12/07/2012      Chemistry      Component Value Date/Time   NA 136 12/07/2012 1148   NA 135 12/09/2011 1258   K 3.5 12/07/2012 1148   K 4.1 12/09/2011 1258   CL 97* 12/07/2012 1148   CL 95* 12/09/2011 1258   CO2 29 12/07/2012 1148   CO2 31 12/09/2011 1258   BUN 14.5 12/07/2012 1148   BUN 14 12/09/2011 1258   CREATININE 0.8 12/07/2012 1148   CREATININE 0.91 12/09/2011 1258      Component Value Date/Time    CALCIUM 9.4 12/07/2012 1148   CALCIUM 9.7 12/09/2011 1258   ALKPHOS 82 12/07/2012 1148   ALKPHOS 74 12/09/2011 1258   AST 17 12/07/2012 1148   AST 18 12/09/2011 1258   ALT 16 12/07/2012 1148   ALT 17 12/09/2011 1258   BILITOT 0.57 12/07/2012 1148   BILITOT 0.9 12/09/2011 1258       RADIOGRAPHIC STUDIES: Ct Chest W Contrast  12/07/2012   *RADIOLOGY REPORT*  Clinical Data: Lung cancer diagnosed in April 2013 status post right upper lobectomy.  Cough.  CT CHEST WITH CONTRAST  Technique:  Multidetector CT imaging of the chest was performed following the standard protocol during bolus administration of intravenous contrast.  Contrast: 80mL OMNIPAQUE IOHEXOL 300 MG/ML  SOLN  Comparison: Chest CT 06/06/2012.  Findings:  Mediastinum: Heart size is normal. There is no significant pericardial fluid, thickening or pericardial calcification. No pathologically enlarged mediastinal or hilar lymph nodes. Esophagus is unremarkable in appearance. There is atherosclerosis of the thoracic aorta, the great vessels of the mediastinum and the coronary arteries, including calcified atherosclerotic plaque in the left main, left anterior descending and right coronary arteries.  Lungs/Pleura: Postoperative changes of right upper lobectomy. Compensatory hyperexpansion of the right middle and lower lobes. No definite soft tissue mass adjacent to the suture lines to suggest local recurrence of disease.  Small nonspecific focus of ground-glass attenuation in the left upper lobe (image 16 of series 5) is similar to the prior examination, measuring approximately 1.2 cm in diameter.  6 mm ground-glass attenuation nodule in the lingula (image 35 of series 5) is also unchanged. No other definite suspicious-appearing pulmonary nodules or masses are identified.  Upper Abdomen: Unremarkable.  Musculoskeletal: There are no aggressive appearing lytic or blastic lesions noted in the visualized portions of the skeleton.  IMPRESSION: 1.  Status post  right upper lobectomy without evidence to suggest local recurrence of disease or new metastatic disease in the thorax. 2.  Two nonspecific ground-glass attenuation nodules in the left upper lobe are unchanged compared to prior examinations.  Continued attention on future follow up studies is recommended. 3. 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 Please note that although the presence of coronary artery calcium documents the presence of coronary artery disease, the severity of this disease and any potential stenosis cannot be assessed on this non-gated CT examination.  Assessment for potential risk factor modification, dietary therapy or pharmacologic therapy may be warranted, if clinically indicated.   Original Report Authenticated By: Trudie Reed, M.D.    ASSESSMENT: This is a very pleasant 67 years old white female with history of stage IA non-small cell lung cancer status post right upper lobectomy with lymph node dissection has  been observation for the last 14 months with no evidence for disease recurrence.   PLAN: I discussed the scan results with the patient today. I recommended for her to continue on observation with repeat CT scan of the chest in 6 months.  She was advised to call immediately if she has any concerning symptoms in the interval.  All questions were answered. The patient knows to call the clinic with any problems, questions or concerns. We can certainly see the patient much sooner if necessary.

## 2012-12-08 NOTE — Telephone Encounter (Signed)
gv and printed appt sched and avs  °

## 2013-04-24 ENCOUNTER — Other Ambulatory Visit: Payer: Self-pay | Admitting: Interventional Cardiology

## 2013-04-24 DIAGNOSIS — I712 Thoracic aortic aneurysm, without rupture: Secondary | ICD-10-CM

## 2013-04-25 ENCOUNTER — Other Ambulatory Visit: Payer: Self-pay | Admitting: Gastroenterology

## 2013-05-09 ENCOUNTER — Telehealth: Payer: Self-pay | Admitting: Interventional Cardiology

## 2013-05-09 NOTE — Telephone Encounter (Signed)
We'll have to see if CT scan can measure size of aorta.

## 2013-05-09 NOTE — Telephone Encounter (Signed)
Message copied by Theda Sers on Tue May 09, 2013  9:51 AM ------      Message from: MCVEY, [place] K      Created: Tue May 09, 2013  9:19 AM      Regarding: MRA chest       Sarahlynn Cisnero,            Dr. Eldridge Dace wanted MRA of the chest in one year from the echo pt had on  02/07/2013.  I was getting ready to scheduled and I noticed that pt is scheduled for CT chest on 06/05/2013 by Dr. Arbutus Ped, will the pt still need the MRA chest in August 2015 ------

## 2013-05-09 NOTE — Telephone Encounter (Signed)
To Linda

## 2013-05-09 NOTE — Telephone Encounter (Signed)
To Dr. Varanasi, please advise.  

## 2013-06-05 ENCOUNTER — Ambulatory Visit (HOSPITAL_COMMUNITY)
Admission: RE | Admit: 2013-06-05 | Discharge: 2013-06-05 | Disposition: A | Discharge status: Home / Self Care | Payer: Medicare Other | Source: Ambulatory Visit | Attending: Internal Medicine | Admitting: Internal Medicine

## 2013-06-05 ENCOUNTER — Other Ambulatory Visit (HOSPITAL_BASED_OUTPATIENT_CLINIC_OR_DEPARTMENT_OTHER): Payer: Medicare Other | Admitting: Lab

## 2013-06-05 DIAGNOSIS — C3490 Malignant neoplasm of unspecified part of unspecified bronchus or lung: Secondary | ICD-10-CM | POA: Insufficient documentation

## 2013-06-05 DIAGNOSIS — R918 Other nonspecific abnormal finding of lung field: Secondary | ICD-10-CM | POA: Insufficient documentation

## 2013-06-05 DIAGNOSIS — C3410 Malignant neoplasm of upper lobe, unspecified bronchus or lung: Secondary | ICD-10-CM

## 2013-06-05 DIAGNOSIS — I2510 Atherosclerotic heart disease of native coronary artery without angina pectoris: Secondary | ICD-10-CM | POA: Insufficient documentation

## 2013-06-05 LAB — COMPREHENSIVE METABOLIC PANEL (CC13)
ALT: 15 U/L (ref 0–55)
AST: 17 U/L (ref 5–34)
Albumin: 3.8 g/dL (ref 3.5–5.0)
Anion Gap: 8 mEq/L (ref 3–11)
CO2: 31 mEq/L — ABNORMAL HIGH (ref 22–29)
Calcium: 9 mg/dL (ref 8.4–10.4)
Chloride: 100 mEq/L (ref 98–109)
Potassium: 3.5 mEq/L (ref 3.5–5.1)
Sodium: 139 mEq/L (ref 136–145)
Total Protein: 6.8 g/dL (ref 6.4–8.3)

## 2013-06-05 LAB — CBC WITH DIFFERENTIAL/PLATELET
BASO%: 1.8 % (ref 0.0–2.0)
EOS%: 2.5 % (ref 0.0–7.0)
MCH: 28.6 pg (ref 25.1–34.0)
MCHC: 32.7 g/dL (ref 31.5–36.0)
MCV: 87.4 fL (ref 79.5–101.0)
MONO%: 11 % (ref 0.0–14.0)
RDW: 14.3 % (ref 11.2–14.5)
lymph#: 2.1 10*3/uL (ref 0.9–3.3)

## 2013-06-05 MED ORDER — IOHEXOL 300 MG/ML  SOLN
80.0000 mL | Freq: Once | INTRAMUSCULAR | Status: AC | PRN
  Administered 2013-06-05: 80 mL via INTRAVENOUS

## 2013-06-06 ENCOUNTER — Encounter: Payer: Self-pay | Admitting: Internal Medicine

## 2013-06-06 ENCOUNTER — Ambulatory Visit (HOSPITAL_BASED_OUTPATIENT_CLINIC_OR_DEPARTMENT_OTHER): Payer: Medicare Other | Admitting: Internal Medicine

## 2013-06-06 DIAGNOSIS — Z85118 Personal history of other malignant neoplasm of bronchus and lung: Secondary | ICD-10-CM

## 2013-06-06 NOTE — Progress Notes (Signed)
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   PRIOR THERAPY: Status post right upper lobectomy with lymph node dissection on 10/22/2011.   CURRENT THERAPY: Observation.   INTERVAL HISTORY:  Tiffany Mcneil 67 y.o. female returns to the clinic today for routine six-month followup visit. The patient is fine today with no specific complaints. She exercises at regular basis. She denied having any significant weight loss or night sweats. She has no chest pain, shortness breath, cough or hemoptysis. The patient had repeat CT scan of the chest performed recently and she is here for evaluation and discussion of her scan results.   MEDICAL HISTORY: Past Medical History  Diagnosis Date  . Lung nodule   . Hyperlipidemia   . Osteoporosis   . Lipoma   . Hypertension   . Allergic rhinitis   . GERD (gastroesophageal reflux disease)   . Ascending aorta dilation     up to 4.3 cm by 08/13/11 echo  . lung ca dx'd 10/22/11    surg only    ALLERGIES:  is allergic to lisinopril.  MEDICATIONS:  Current Outpatient Prescriptions  Medication Sig Dispense Refill  . amLODipine (NORVASC) 5 MG tablet Take 1 tablet by mouth Daily.      . Ascorbic Acid (VITAMIN C) 1000 MG tablet Take 1,000 mg by mouth daily.      Marland Kitchen aspirin 81 MG tablet Take 81 mg by mouth daily.      Marland Kitchen atorvastatin (LIPITOR) 20 MG tablet Take 40 mg by mouth at bedtime.       . cholecalciferol (VITAMIN D) 1000 UNITS tablet Take 1,000 Units by mouth daily.      . Glucosamine-Chondroit-Vit C-Mn (GLUCOSAMINE 1500 COMPLEX PO) Take 2 tablets by mouth daily.      . hydrochlorothiazide (MICROZIDE) 12.5  MG capsule Take 12.5 mg by mouth Daily.      Marland Kitchen loratadine (CLARITIN) 10 MG tablet Take 10 mg by mouth daily.      . Multiple Vitamins-Minerals (MULTIVITAL) tablet Take 1 tablet by mouth daily.      . nitroGLYCERIN (NITROSTAT) 0.4 MG SL tablet Place 0.4 mg under the tongue every 5 (five) minutes as needed. For chest pain      . omeprazole (PRILOSEC) 20 MG capsule Take 20 mg by mouth daily.       No current facility-administered medications for this visit.    SURGICAL HISTORY:  Past Surgical History  Procedure Laterality Date  . Wrist surgery      right  . Eye surgery      lasik sx, "lazy eye sx"  . Lung surgery  10/22/11    removal lymph nodes and nodules   . Appendectomy  1963  . Vaginal hysterectomy  1993    REVIEW OF SYSTEMS:  A comprehensive review of systems was negative.   PHYSICAL EXAMINATION: General appearance: alert, cooperative and no distress Head: Normocephalic, without obvious abnormality, atraumatic Neck: no adenopathy, no JVD, supple, symmetrical, trachea midline and thyroid not enlarged, symmetric, no tenderness/mass/nodules Lymph nodes: Cervical, supraclavicular, and axillary nodes normal. Resp: clear to auscultation bilaterally Back: symmetric, no curvature. ROM normal. No CVA tenderness. Cardio: regular rate and rhythm,  S1, S2 normal, no murmur, click, rub or gallop GI: soft, non-tender; bowel sounds normal; no masses,  no organomegaly Extremities: extremities normal, atraumatic, no cyanosis or edema  ECOG PERFORMANCE STATUS: 0 - Asymptomatic  Blood pressure 121/73, pulse 92, temperature 97.5 F (36.4 C), temperature source Oral, resp. rate 18, height 5\' 8"  (1.727 m), weight 173 lb 6.4 oz (78.654 kg).  LABORATORY DATA: Lab Results  Component Value Date   WBC 5.9 06/05/2013   HGB 12.2 06/05/2013   HCT 37.3 06/05/2013   MCV 87.4 06/05/2013   PLT 286 06/05/2013      Chemistry      Component Value Date/Time   NA 139 06/05/2013 1516   NA 135 12/09/2011  1258   K 3.5 06/05/2013 1516   K 4.1 12/09/2011 1258   CL 97* 12/07/2012 1148   CL 95* 12/09/2011 1258   CO2 31* 06/05/2013 1516   CO2 31 12/09/2011 1258   BUN 19.3 06/05/2013 1516   BUN 14 12/09/2011 1258   CREATININE 0.9 06/05/2013 1516   CREATININE 0.91 12/09/2011 1258      Component Value Date/Time   CALCIUM 9.0 06/05/2013 1516   CALCIUM 9.7 12/09/2011 1258   ALKPHOS 79 06/05/2013 1516   ALKPHOS 74 12/09/2011 1258   AST 17 06/05/2013 1516   AST 18 12/09/2011 1258   ALT 15 06/05/2013 1516   ALT 17 12/09/2011 1258   BILITOT 0.35 06/05/2013 1516   BILITOT 0.9 12/09/2011 1258       RADIOGRAPHIC STUDIES: Ct Chest W Contrast  06/05/2013   CLINICAL DATA:  Lung cancer.  EXAM: CT CHEST WITH CONTRAST  TECHNIQUE: Multidetector CT imaging of the chest was performed during intravenous contrast administration.  CONTRAST:  80mL OMNIPAQUE IOHEXOL 300 MG/ML  SOLN  COMPARISON:  12/07/2012.  FINDINGS: No pathologically enlarged mediastinal, hilar or axillary lymph nodes. Atherosclerotic calcification of the arterial vasculature, including involvement of at least the left anterior descending coronary artery. Heart size normal. No pericardial effusion.  Postoperative changes and volume loss are seen in the right hemi thorax, as before. A 6 x 10 mm ground-glass nodule in the left upper lobe (image 16) is unchanged. Similarly, a 7 mm ground-glass nodule in the lingula (image 36) is also unchanged. No pleural fluid. Airway is otherwise unremarkable.  Incidental imaging of the upper abdomen shows no acute findings. No worrisome lytic or sclerotic lesions.  IMPRESSION: 1. Postoperative changes and volume loss in the right hemi thorax, stable. 2. Left upper lobe ground-glass nodules, stable. Continued attention on followup exams is warranted.   Electronically Signed   By: Leanna Battles M.D.   On: 06/05/2013 18:22    ASSESSMENT AND PLAN:  This is a very pleasant 67 years old white female with history of stage IA  non-small cell lung cancer status post right upper lobectomy with lymph node dissection has been observation with no evidence for disease recurrence.  I discussed the scan results with the patient today. I recommended for her to continue on observation with repeat CT scan of the chest in 6 months.  She was advised to call immediately if she has any concerning symptoms in the interval.  All questions were answered. The patient knows to call the clinic with any problems, questions or concerns. We can certainly see the patient much sooner if necessary.

## 2013-06-06 NOTE — Patient Instructions (Signed)
Followup visit in 6 months with repeat CT scan of the chest. 

## 2013-06-07 ENCOUNTER — Telehealth: Payer: Self-pay | Admitting: Internal Medicine

## 2013-06-07 NOTE — Telephone Encounter (Signed)
s.w. pt and advised on May  2015 appt...pt ok and aware °

## 2013-12-04 ENCOUNTER — Other Ambulatory Visit: Payer: Medicare Other

## 2013-12-05 ENCOUNTER — Ambulatory Visit: Payer: Medicare Other | Admitting: Internal Medicine

## 2013-12-05 ENCOUNTER — Encounter (HOSPITAL_COMMUNITY): Payer: Self-pay

## 2013-12-05 ENCOUNTER — Ambulatory Visit (HOSPITAL_COMMUNITY)
Admission: RE | Admit: 2013-12-05 | Discharge: 2013-12-05 | Disposition: A | Discharge status: Home / Self Care | Payer: Medicare Other | Source: Ambulatory Visit | Attending: Internal Medicine | Admitting: Internal Medicine

## 2013-12-05 ENCOUNTER — Other Ambulatory Visit (HOSPITAL_BASED_OUTPATIENT_CLINIC_OR_DEPARTMENT_OTHER): Payer: Medicare Other

## 2013-12-05 DIAGNOSIS — C3410 Malignant neoplasm of upper lobe, unspecified bronchus or lung: Secondary | ICD-10-CM

## 2013-12-05 DIAGNOSIS — Z902 Acquired absence of lung [part of]: Secondary | ICD-10-CM | POA: Insufficient documentation

## 2013-12-05 LAB — CBC WITH DIFFERENTIAL/PLATELET
BASO%: 0.7 % (ref 0.0–2.0)
Basophils Absolute: 0.1 10*3/uL (ref 0.0–0.1)
EOS ABS: 0.3 10*3/uL (ref 0.0–0.5)
EOS%: 3.6 % (ref 0.0–7.0)
HCT: 40.2 % (ref 34.8–46.6)
HGB: 13.5 g/dL (ref 11.6–15.9)
LYMPH#: 2.3 10*3/uL (ref 0.9–3.3)
LYMPH%: 31.6 % (ref 14.0–49.7)
MCH: 29.3 pg (ref 25.1–34.0)
MCHC: 33.6 g/dL (ref 31.5–36.0)
MCV: 87.4 fL (ref 79.5–101.0)
MONO#: 0.6 10*3/uL (ref 0.1–0.9)
MONO%: 8.8 % (ref 0.0–14.0)
NEUT%: 55.3 % (ref 38.4–76.8)
NEUTROS ABS: 4 10*3/uL (ref 1.5–6.5)
Platelets: 281 10*3/uL (ref 145–400)
RBC: 4.6 10*6/uL (ref 3.70–5.45)
RDW: 13.7 % (ref 11.2–14.5)
WBC: 7.2 10*3/uL (ref 3.9–10.3)

## 2013-12-05 LAB — COMPREHENSIVE METABOLIC PANEL (CC13)
ALBUMIN: 4.2 g/dL (ref 3.5–5.0)
ALT: 20 U/L (ref 0–55)
AST: 18 U/L (ref 5–34)
Alkaline Phosphatase: 84 U/L (ref 40–150)
Anion Gap: 11 mEq/L (ref 3–11)
BUN: 14.6 mg/dL (ref 7.0–26.0)
CALCIUM: 10 mg/dL (ref 8.4–10.4)
CHLORIDE: 97 meq/L — AB (ref 98–109)
CO2: 30 meq/L — AB (ref 22–29)
Creatinine: 0.9 mg/dL (ref 0.6–1.1)
GLUCOSE: 106 mg/dL (ref 70–140)
POTASSIUM: 4.1 meq/L (ref 3.5–5.1)
SODIUM: 138 meq/L (ref 136–145)
TOTAL PROTEIN: 7.4 g/dL (ref 6.4–8.3)
Total Bilirubin: 0.53 mg/dL (ref 0.20–1.20)

## 2013-12-05 MED ORDER — IOHEXOL 300 MG/ML  SOLN
80.0000 mL | Freq: Once | INTRAMUSCULAR | Status: AC | PRN
  Administered 2013-12-05: 80 mL via INTRAVENOUS

## 2013-12-06 ENCOUNTER — Encounter: Payer: Self-pay | Admitting: Internal Medicine

## 2013-12-06 ENCOUNTER — Ambulatory Visit (HOSPITAL_BASED_OUTPATIENT_CLINIC_OR_DEPARTMENT_OTHER): Payer: Medicare Other | Admitting: Internal Medicine

## 2013-12-06 VITALS — BP 120/57 | HR 89 | Temp 97.8°F | Resp 18 | Ht 68.0 in | Wt 177.4 lb

## 2013-12-06 DIAGNOSIS — R51 Headache: Secondary | ICD-10-CM

## 2013-12-06 DIAGNOSIS — C3490 Malignant neoplasm of unspecified part of unspecified bronchus or lung: Secondary | ICD-10-CM

## 2013-12-06 DIAGNOSIS — C3410 Malignant neoplasm of upper lobe, unspecified bronchus or lung: Secondary | ICD-10-CM

## 2013-12-06 DIAGNOSIS — M810 Age-related osteoporosis without current pathological fracture: Secondary | ICD-10-CM

## 2013-12-06 NOTE — Progress Notes (Signed)
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   PRIOR THERAPY: Status post right upper lobectomy with lymph node dissection on 10/22/2011.   CURRENT THERAPY: Observation.   INTERVAL HISTORY:  Tiffany Mcneil 68 y.o. female returns to the clinic today for routine six-month followup visit. She complained today of recent changes in her vision and seeing splashs of light in her eyes. She also has intermittent headache. She exercises at regular basis. She denied having any significant weight loss or night sweats. She has no chest pain, shortness of breath, cough or hemoptysis. The patient had repeat CT scan of the chest performed recently and she is here for evaluation and discussion of her scan results.   MEDICAL HISTORY: Past Medical History  Diagnosis Date  . Lung nodule   . Hyperlipidemia   . Osteoporosis   . Lipoma   . Hypertension   . Allergic rhinitis   . GERD (gastroesophageal reflux disease)   . Ascending aorta dilation     up to 4.3 cm by 08/13/11 echo  . lung ca dx'd 10/22/11    surg only    ALLERGIES:  is allergic to lisinopril.  MEDICATIONS:  Current Outpatient Prescriptions  Medication Sig Dispense Refill  . amLODipine (NORVASC) 5 MG tablet Take 1 tablet by mouth Daily.      . Ascorbic Acid (VITAMIN C) 1000 MG tablet Take 1,000 mg by mouth daily.      Marland Kitchen aspirin 81 MG tablet Take 81 mg by mouth daily.      Marland Kitchen atorvastatin (LIPITOR) 20 MG tablet Take 40 mg by mouth at bedtime.       . cholecalciferol (VITAMIN D) 1000 UNITS tablet Take 1,000 Units by mouth daily.      . Glucosamine-Chondroit-Vit C-Mn (GLUCOSAMINE 1500 COMPLEX PO)  Take 2 tablets by mouth daily.      . hydrochlorothiazide (MICROZIDE) 12.5 MG capsule Take 12.5 mg by mouth Daily.      Marland Kitchen loratadine (CLARITIN) 10 MG tablet Take 10 mg by mouth daily.      . Multiple Vitamins-Minerals (MULTIVITAL) tablet Take 1 tablet by mouth daily.      . nitroGLYCERIN (NITROSTAT) 0.4 MG SL tablet Place 0.4 mg under the tongue every 5 (five) minutes as needed. For chest pain      . omeprazole (PRILOSEC) 20 MG capsule Take 20 mg by mouth daily.       No current facility-administered medications for this visit.    SURGICAL HISTORY:  Past Surgical History  Procedure Laterality Date  . Wrist surgery      right  . Eye surgery      lasik sx, "lazy eye sx"  . Lung surgery  10/22/11    removal lymph nodes and nodules   . Appendectomy  1963  . Vaginal hysterectomy  1993    REVIEW OF SYSTEMS:  A comprehensive review of systems was negative except for: Eyes: positive for visual disturbance Neurological: positive for headaches   PHYSICAL EXAMINATION: General appearance: alert, cooperative and no distress Head: Normocephalic, without obvious abnormality, atraumatic Neck: no adenopathy, no JVD, supple, symmetrical, trachea midline and thyroid not enlarged, symmetric, no tenderness/mass/nodules Lymph nodes:  Cervical, supraclavicular, and axillary nodes normal. Resp: clear to auscultation bilaterally Back: symmetric, no curvature. ROM normal. No CVA tenderness. Cardio: regular rate and rhythm, S1, S2 normal, no murmur, click, rub or gallop GI: soft, non-tender; bowel sounds normal; no masses,  no organomegaly Extremities: extremities normal, atraumatic, no cyanosis or edema  ECOG PERFORMANCE STATUS: 1 - Symptomatic but completely ambulatory  Blood pressure 120/57, pulse 89, temperature 97.8 F (36.6 C), temperature source Oral, resp. rate 18, height 5' 8" (1.727 m), weight 177 lb 6.4 oz (80.468 kg).  LABORATORY DATA: Lab Results  Component Value Date   WBC 7.2 12/05/2013     HGB 13.5 12/05/2013   HCT 40.2 12/05/2013   MCV 87.4 12/05/2013   PLT 281 12/05/2013      Chemistry      Component Value Date/Time   NA 138 12/05/2013 1158   NA 135 12/09/2011 1258   K 4.1 12/05/2013 1158   K 4.1 12/09/2011 1258   CL 97* 12/07/2012 1148   CL 95* 12/09/2011 1258   CO2 30* 12/05/2013 1158   CO2 31 12/09/2011 1258   BUN 14.6 12/05/2013 1158   BUN 14 12/09/2011 1258   CREATININE 0.9 12/05/2013 1158   CREATININE 0.91 12/09/2011 1258      Component Value Date/Time   CALCIUM 10.0 12/05/2013 1158   CALCIUM 9.7 12/09/2011 1258   ALKPHOS 84 12/05/2013 1158   ALKPHOS 74 12/09/2011 1258   AST 18 12/05/2013 1158   AST 18 12/09/2011 1258   ALT 20 12/05/2013 1158   ALT 17 12/09/2011 1258   BILITOT 0.53 12/05/2013 1158   BILITOT 0.9 12/09/2011 1258       RADIOGRAPHIC STUDIES: Ct Chest W Contrast  12/05/2013   CLINICAL DATA:  Followup right lung carcinoma and left lung subsolid nodules. Previous right lung resection.  EXAM: CT CHEST WITH CONTRAST  TECHNIQUE: Multidetector CT imaging of the chest was performed during intravenous contrast administration.  CONTRAST:  45m OMNIPAQUE IOHEXOL 300 MG/ML  SOLN  COMPARISON:  06/05/2013 and 12/07/2012  FINDINGS: Postop changes from previous right upper lobe resection remain stable. No recurrent pulmonary nodules or masses identified within the right lung. No evidence of central endobronchial obstruction.  Appear ground-glass nodule in the central left upper lobe on image 14 measures 8 x 11 mm and is not significant changed in size or appearance since prior study. Smaller 6 mm ground-glass nodule in the lingula on image 34 is also stable. No new or enlarging pulmonary nodules identified. No evidence of acute infiltrate or pleural effusion.  No evidence of hilar or mediastinal lymphadenopathy. No adenopathy seen elsewhere within the thorax. Both adrenal glands are normal in appearance. No suspicious bone lesions identified.  IMPRESSION: Stable postop changes in  right upper lobe.  No acute findings.  Stable left upper lobe ground-glass pulmonary nodules, suspicious for adenocarcinoma in situ. Recommend continued followup by CT annually. This recommendation follows the consensus statement: Recommendations for the Management of Subsolid Pulmonary Nodules Detected at CT: A Statement from the [facility] [id number]   Electronically Signed   By: JEarle GellM.D.   On: 12/05/2013 14:54   ASSESSMENT AND PLAN:  This is a very pleasant 68years old white female with history of stage IA non-small cell lung cancer status post right upper lobectomy with lymph node dissection has been observation with no evidence for disease recurrence.  The patient is feeling fine today except for the visual changes and headache. I discussed the scan  results with the patient today. I recommended for her to continue on observation with repeat CT scan of the chest in 6 months.  For the headache and visual changes, I ordered a CT scan of the head with and without contrast to rule out any brain metastasis. The patient would like her scan to be performed in [facility]. She was given prescription for the scan. I will call the patient with the results and further recommendations if there is any concerning findings, otherwise she would come back for followup visit in 6 months as scheduled. She was advised to call immediately if she has any concerning symptoms in the interval.   All questions were answered. The patient knows to call the clinic with any problems, questions or concerns. We can certainly see the patient much sooner if necessary.  Disclaimer: This note was dictated with voice recognition software. Similar sounding words can inadvertently be transcribed and may not be corrected upon review.

## 2013-12-07 ENCOUNTER — Telehealth: Payer: Self-pay | Admitting: *Deleted

## 2013-12-07 NOTE — Telephone Encounter (Signed)
Received call regarding CT of the head that pt is having done in [place], [place].  Dr Vista Mink gave a rx for the scan to patient.  Prior authorization is required for scan.  Msg forwarded to Dannielle Huh.

## 2013-12-08 ENCOUNTER — Telehealth: Payer: Self-pay | Admitting: Internal Medicine

## 2013-12-08 NOTE — Telephone Encounter (Signed)
called pt re appts for nov/dec. per pt she was driving and could not take appts. schedule mailed. pt aware.

## 2013-12-22 IMAGING — CT CT CHEST W/ CM
2 of 3 series · 15 of 36 positions shown, 18 images · IV contrast (OMNIPAQUE)
Comparison: 12/07/2012.

CLINICAL DATA: Lung cancer.

EXAM:
CT CHEST WITH CONTRAST
TECHNIQUE: Multidetector CT imaging of the chest was performed during
intravenous contrast administration.
CONTRAST:  80mL OMNIPAQUE IOHEXOL 300 MG/ML  SOLN

[Series 2: chest with st · axial · 0.77mm/px · z∈[-48,+208]mm · 12 of 61 slices shown, 15 images]
[im 5/61  mediastinal]
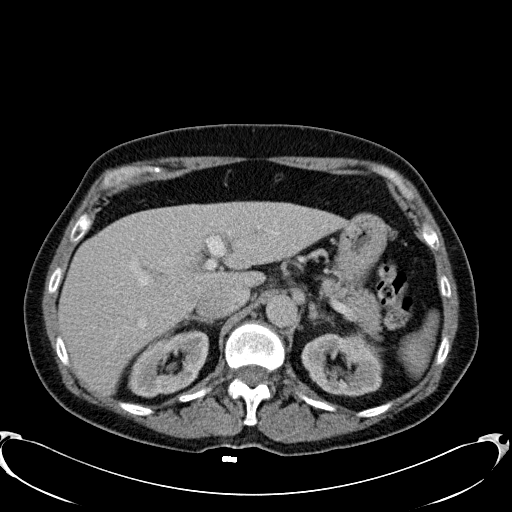
[im 5/61  lung]
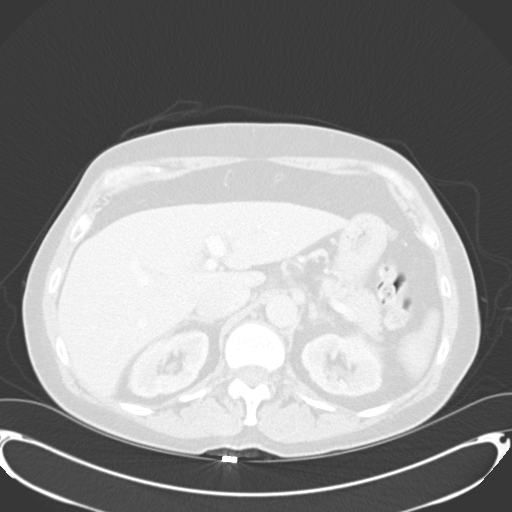
[im 9/61  lung]
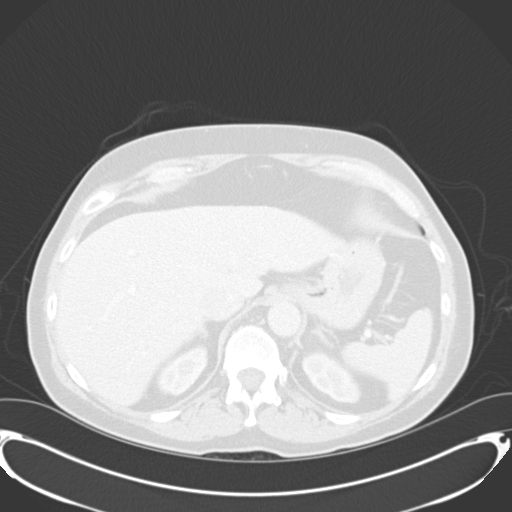
[im 14/61  lung]
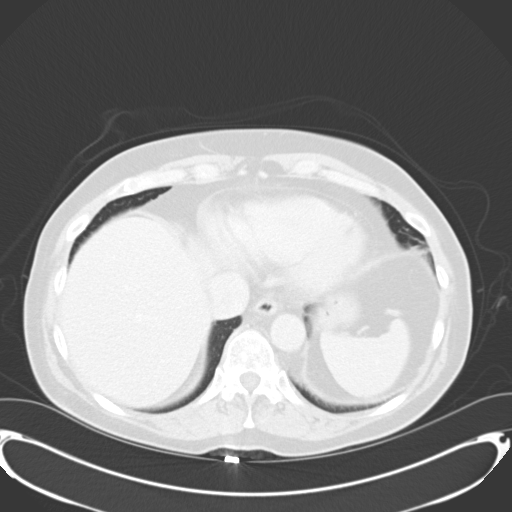
[im 18/61  lung]
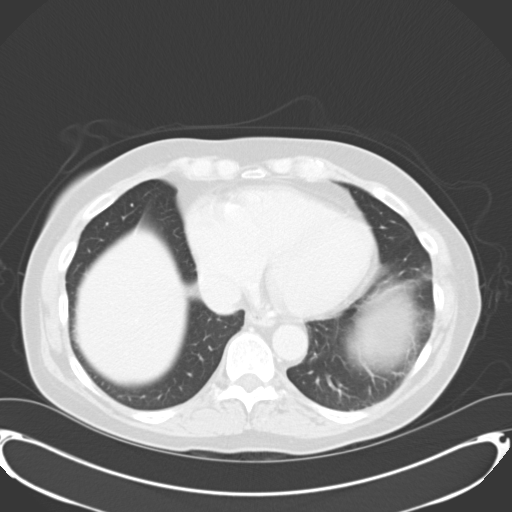
[im 23/61  mediastinal]
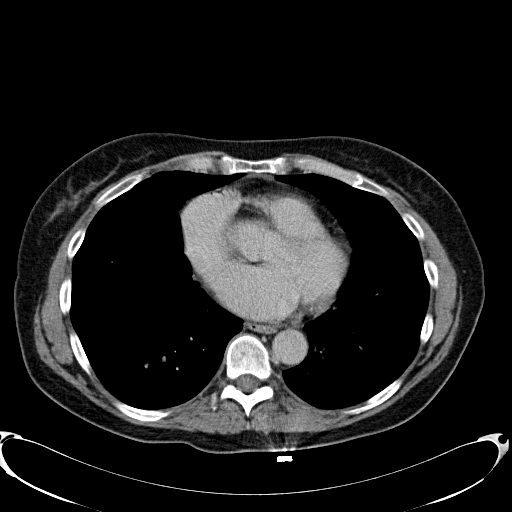
[im 23/61  lung]
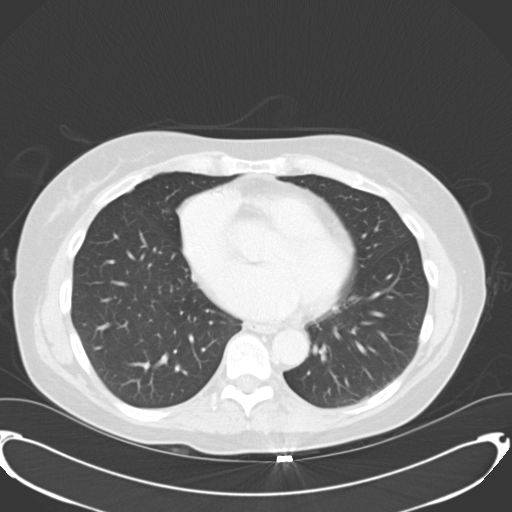
[im 27/61  lung]
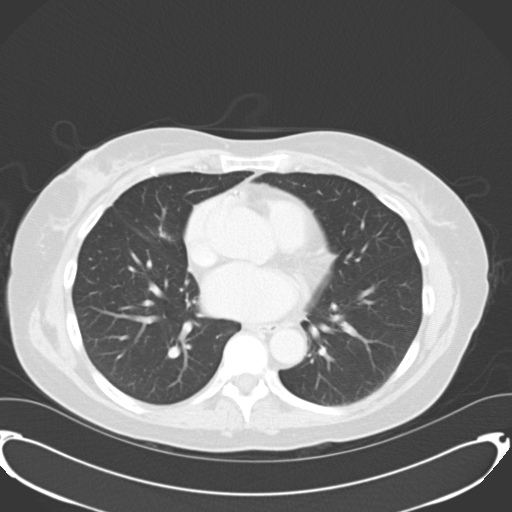
[im 34/61  lung]
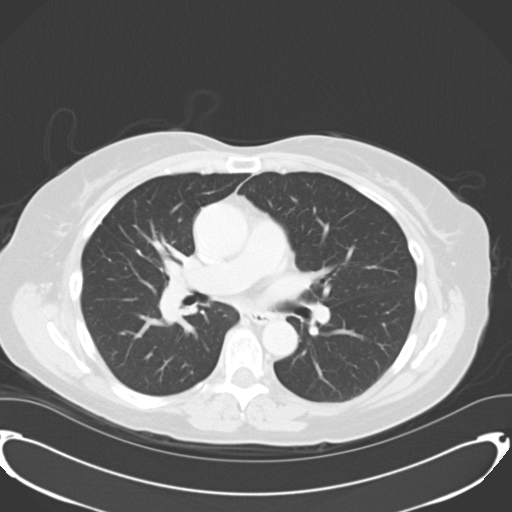
[im 38/61  lung]
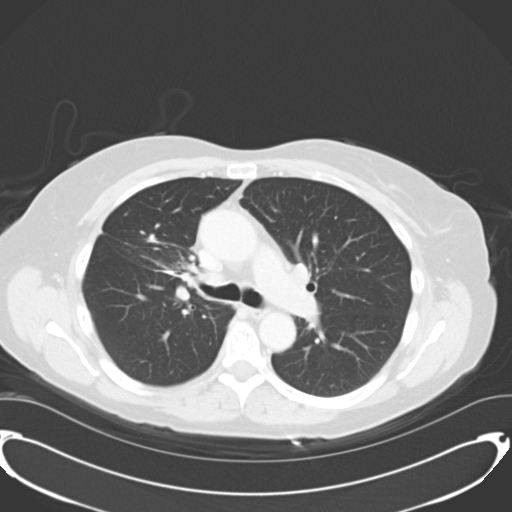
[im 43/61  mediastinal]
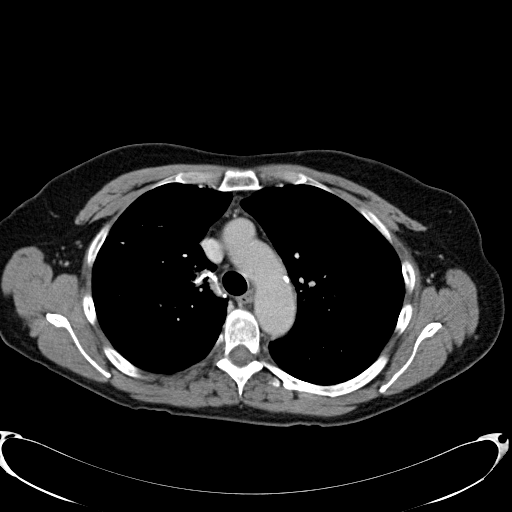
[im 43/61  lung]
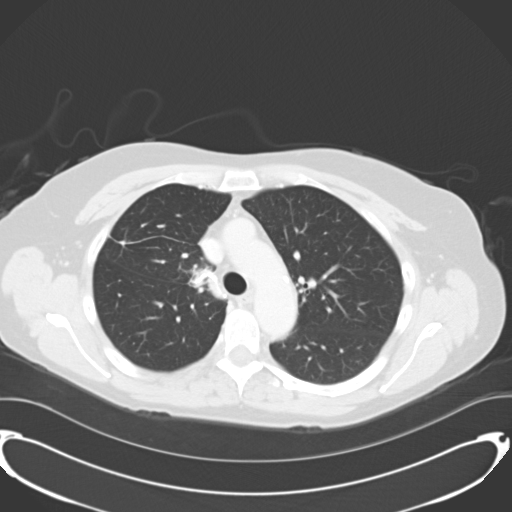
[im 47/61  lung]
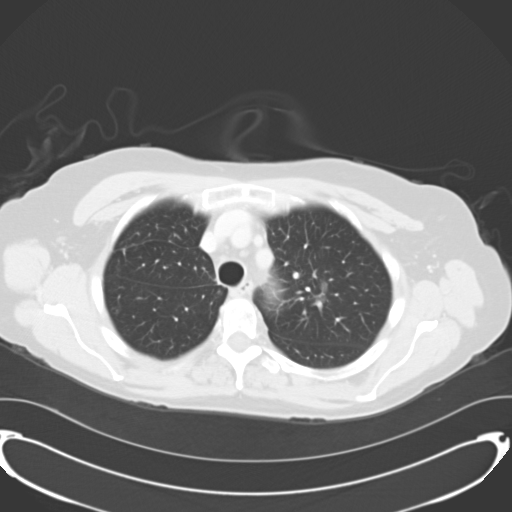
[im 52/61  lung]
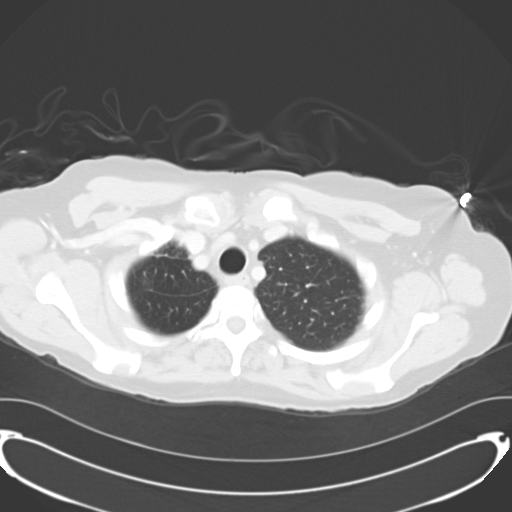
[im 56/61  lung]
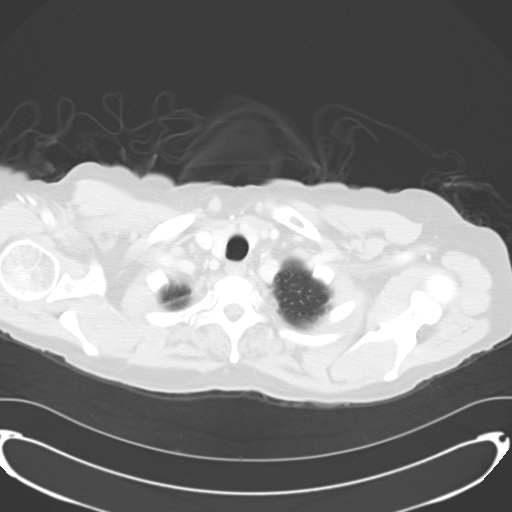

[Series 602: cor · coronal · 0.77mm/px · 3 of 111 slices shown]
[im 23/111  lung]
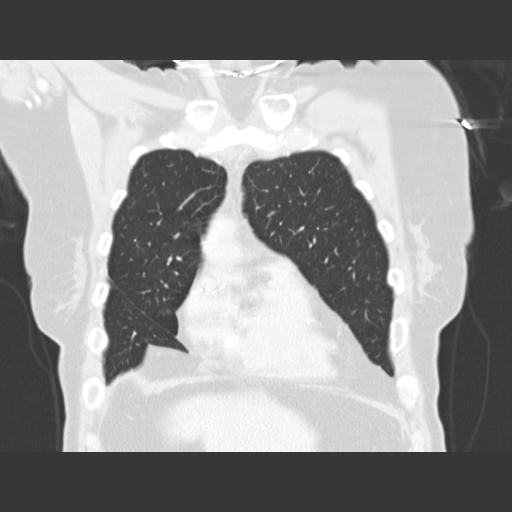
[im 45/111  lung]
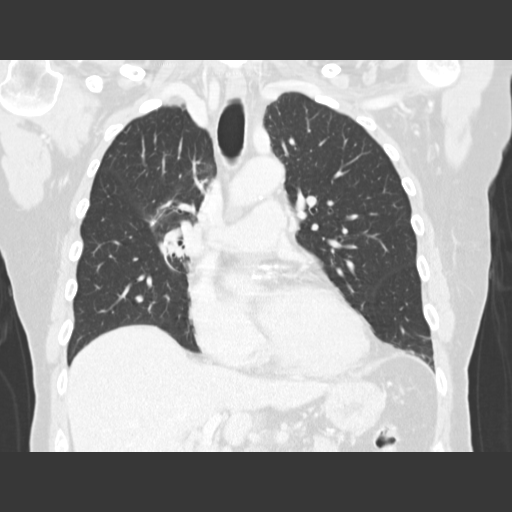
[im 67/111  lung]
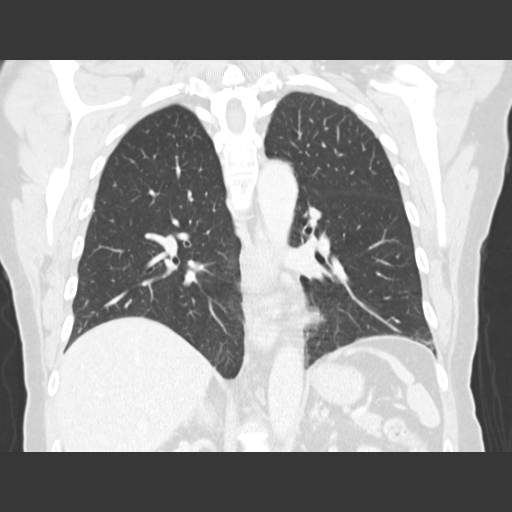

[15 of 36 positions shown; findings below may reference images not displayed]

FINDINGS: No pathologically enlarged mediastinal, hilar or axillary lymph
nodes. Atherosclerotic calcification of the arterial vasculature,
including involvement of at least the left anterior descending
coronary artery. Heart size normal. No pericardial effusion.

Postoperative changes and volume loss are seen in the right hemi
thorax, as before. A 6 x 10 mm ground-glass nodule in the left upper
lobe (image 16) is unchanged. Similarly, a 7 mm ground-glass nodule
in the lingula (image 36) is also unchanged. No pleural fluid.
Airway is otherwise unremarkable.

Incidental imaging of the upper abdomen shows no acute findings. No
worrisome lytic or sclerotic lesions.
IMPRESSION: 1. Postoperative changes and volume loss in the right hemi thorax,
stable.
2. Left upper lobe ground-glass nodules, stable. Continued attention
on followup exams is warranted.

## 2013-12-26 ENCOUNTER — Telehealth: Payer: Self-pay | Admitting: Interventional Cardiology

## 2013-12-26 NOTE — Telephone Encounter (Signed)
New problem ° ° °Pt returning your call. °

## 2013-12-26 NOTE — Telephone Encounter (Signed)
lmtrc

## 2013-12-27 NOTE — Telephone Encounter (Signed)
Pt notified that she will need a BUN and Creat drawn sometime in July prior to her MRA Chest in August. Will fax order for [facility] in [place], [place].

## 2013-12-27 NOTE — Telephone Encounter (Signed)
Order faxed to [facility] at [phone].

## 2013-12-27 NOTE — Telephone Encounter (Signed)
lmtrc

## 2014-01-24 ENCOUNTER — Telehealth: Payer: Self-pay | Admitting: Interventional Cardiology

## 2014-01-24 NOTE — Telephone Encounter (Signed)
New Prob    Pt is requesting to change her MRI date and time. Please call.

## 2014-01-24 NOTE — Telephone Encounter (Signed)
Staff msg to [facility] requesting rescheduled of cardiac MRI test.

## 2014-01-31 ENCOUNTER — Encounter: Payer: Self-pay | Admitting: Interventional Cardiology

## 2014-02-01 NOTE — Telephone Encounter (Signed)
Follow Up ° °Pt returned call//  °

## 2014-02-01 NOTE — Telephone Encounter (Signed)
lmtrc

## 2014-02-01 NOTE — Telephone Encounter (Signed)
Follow up    Patient calling back  To speak with nurse Fax # [phone]

## 2014-02-01 NOTE — Telephone Encounter (Signed)
Spoke with pt and she needs me to re-fax lab order to [facility].

## 2014-02-06 ENCOUNTER — Encounter: Payer: Self-pay | Admitting: Interventional Cardiology

## 2014-02-12 ENCOUNTER — Ambulatory Visit
Admission: RE | Admit: 2014-02-12 | Discharge: 2014-02-12 | Disposition: A | Discharge status: Home / Self Care | Payer: Medicare Other | Source: Ambulatory Visit | Attending: Interventional Cardiology | Admitting: Interventional Cardiology

## 2014-02-12 DIAGNOSIS — I712 Thoracic aortic aneurysm, without rupture, unspecified: Secondary | ICD-10-CM

## 2014-02-12 MED ORDER — GADOBENATE DIMEGLUMINE 529 MG/ML IV SOLN
15.0000 mL | Freq: Once | INTRAVENOUS | Status: AC | PRN
Start: 2014-02-12 — End: 2014-02-12
  Administered 2014-02-12: 15 mL via INTRAVENOUS

## 2014-02-15 ENCOUNTER — Telehealth: Payer: Self-pay | Admitting: Cardiology

## 2014-02-15 NOTE — Telephone Encounter (Signed)
Message copied by Alcario Drought on Thu Feb 15, 2014  2:24 PM ------      Message from: Jettie Booze      Created: Tue Feb 13, 2014  6:41 PM       No change from prior.  Followup study in 2 years. ------

## 2014-06-05 ENCOUNTER — Other Ambulatory Visit: Payer: Self-pay | Admitting: *Deleted

## 2014-06-05 ENCOUNTER — Other Ambulatory Visit (HOSPITAL_BASED_OUTPATIENT_CLINIC_OR_DEPARTMENT_OTHER): Payer: Medicare Other

## 2014-06-05 ENCOUNTER — Encounter (HOSPITAL_COMMUNITY): Payer: Self-pay

## 2014-06-05 ENCOUNTER — Other Ambulatory Visit: Payer: Self-pay | Admitting: Internal Medicine

## 2014-06-05 ENCOUNTER — Ambulatory Visit (HOSPITAL_COMMUNITY)
Admission: RE | Admit: 2014-06-05 | Discharge: 2014-06-05 | Disposition: A | Discharge status: Home / Self Care | Payer: Medicare Other | Source: Ambulatory Visit | Attending: Internal Medicine | Admitting: Internal Medicine

## 2014-06-05 DIAGNOSIS — Z902 Acquired absence of lung [part of]: Secondary | ICD-10-CM | POA: Insufficient documentation

## 2014-06-05 DIAGNOSIS — C3490 Malignant neoplasm of unspecified part of unspecified bronchus or lung: Secondary | ICD-10-CM

## 2014-06-05 DIAGNOSIS — I771 Stricture of artery: Secondary | ICD-10-CM | POA: Diagnosis not present

## 2014-06-05 DIAGNOSIS — C3411 Malignant neoplasm of upper lobe, right bronchus or lung: Secondary | ICD-10-CM

## 2014-06-05 DIAGNOSIS — R911 Solitary pulmonary nodule: Secondary | ICD-10-CM | POA: Insufficient documentation

## 2014-06-05 DIAGNOSIS — Z85118 Personal history of other malignant neoplasm of bronchus and lung: Secondary | ICD-10-CM

## 2014-06-05 DIAGNOSIS — I2510 Atherosclerotic heart disease of native coronary artery without angina pectoris: Secondary | ICD-10-CM | POA: Insufficient documentation

## 2014-06-05 LAB — CBC WITH DIFFERENTIAL/PLATELET
BASO%: 1.4 % (ref 0.0–2.0)
BASOS ABS: 0.1 10*3/uL (ref 0.0–0.1)
EOS ABS: 0.1 10*3/uL (ref 0.0–0.5)
EOS%: 1.6 % (ref 0.0–7.0)
HEMATOCRIT: 41.3 % (ref 34.8–46.6)
HEMOGLOBIN: 13.7 g/dL (ref 11.6–15.9)
LYMPH%: 34.5 % (ref 14.0–49.7)
MCH: 29.2 pg (ref 25.1–34.0)
MCHC: 33.1 g/dL (ref 31.5–36.0)
MCV: 88.1 fL (ref 79.5–101.0)
MONO#: 0.7 10*3/uL (ref 0.1–0.9)
MONO%: 9.6 % (ref 0.0–14.0)
NEUT%: 52.9 % (ref 38.4–76.8)
NEUTROS ABS: 3.8 10*3/uL (ref 1.5–6.5)
PLATELETS: 279 10*3/uL (ref 145–400)
RBC: 4.69 10*6/uL (ref 3.70–5.45)
RDW: 13.4 % (ref 11.2–14.5)
WBC: 7.2 10*3/uL (ref 3.9–10.3)
lymph#: 2.5 10*3/uL (ref 0.9–3.3)

## 2014-06-05 LAB — COMPREHENSIVE METABOLIC PANEL (CC13)
ALK PHOS: 104 U/L (ref 40–150)
ALT: 23 U/L (ref 0–55)
AST: 23 U/L (ref 5–34)
Albumin: 4.2 g/dL (ref 3.5–5.0)
Anion Gap: 13 mEq/L — ABNORMAL HIGH (ref 3–11)
BUN: 10.4 mg/dL (ref 7.0–26.0)
CALCIUM: 9.7 mg/dL (ref 8.4–10.4)
CO2: 29 mEq/L (ref 22–29)
CREATININE: 0.8 mg/dL (ref 0.6–1.1)
Chloride: 95 mEq/L — ABNORMAL LOW (ref 98–109)
Glucose: 104 mg/dl (ref 70–140)
Potassium: 3 mEq/L — CL (ref 3.5–5.1)
Sodium: 136 mEq/L (ref 136–145)
Total Bilirubin: 0.66 mg/dL (ref 0.20–1.20)
Total Protein: 7.3 g/dL (ref 6.4–8.3)

## 2014-06-05 MED ORDER — POTASSIUM CHLORIDE CRYS ER 20 MEQ PO TBCR: 20.0000 meq | EXTENDED_RELEASE_TABLET | Freq: Every day | ORAL | Status: DC

## 2014-06-05 MED ORDER — IOHEXOL 300 MG/ML  SOLN
80.0000 mL | Freq: Once | INTRAMUSCULAR | Status: AC | PRN
  Administered 2014-06-05: 80 mL via INTRAVENOUS

## 2014-06-05 NOTE — Telephone Encounter (Signed)
This RN called patient and informed her that potassium level was 3.0. Dr. Julien Nordmann wants her to take K-Dur 20 mEQ po daily x 7 days. Patient verbalized understanding. Prescription e-scribed to her pharmacy.

## 2014-06-06 ENCOUNTER — Telehealth: Payer: Self-pay | Admitting: *Deleted

## 2014-06-06 ENCOUNTER — Telehealth: Payer: Self-pay | Admitting: Internal Medicine

## 2014-06-06 NOTE — Telephone Encounter (Signed)
Pt is requesting to cancel her f/u appt for 12/1 and wants to r/s a f/u in another 6 months.  Had CT scan 11/24.  Per Dr Vista Mink, pt needs to r/s her f/u and come in to review her CT results.  Informed pt, she verbalized understanding.  Onc tx schedule filled out.

## 2014-06-06 NOTE — Telephone Encounter (Signed)
s.w. pt and r/s appt...done...pt ok and aware of new d.t

## 2014-06-12 ENCOUNTER — Ambulatory Visit: Payer: Medicare Other | Admitting: Internal Medicine

## 2014-06-25 ENCOUNTER — Ambulatory Visit (HOSPITAL_BASED_OUTPATIENT_CLINIC_OR_DEPARTMENT_OTHER): Payer: Medicare Other | Admitting: Internal Medicine

## 2014-06-25 ENCOUNTER — Telehealth: Payer: Self-pay | Admitting: Internal Medicine

## 2014-06-25 ENCOUNTER — Encounter: Payer: Self-pay | Admitting: Internal Medicine

## 2014-06-25 VITALS — BP 128/79 | HR 101 | Temp 98.3°F | Resp 18 | Ht 68.0 in | Wt 176.9 lb

## 2014-06-25 DIAGNOSIS — C3491 Malignant neoplasm of unspecified part of right bronchus or lung: Secondary | ICD-10-CM

## 2014-06-25 DIAGNOSIS — Z85118 Personal history of other malignant neoplasm of bronchus and lung: Secondary | ICD-10-CM

## 2014-06-25 NOTE — Progress Notes (Signed)
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   PRIOR THERAPY: Status post right upper lobectomy with lymph node dissection on 10/22/2011.   CURRENT THERAPY: Observation.   INTERVAL HISTORY:  Tiffany Mcneil 68 y.o. female returns to the clinic today for routine six-month followup visit. The patient is feeling fine today with no specific complaints except anxiety about the scan results. She denied having any significant weight loss or night sweats. She has no chest pain, shortness of breath, cough or hemoptysis. The patient had repeat CT scan of the chest performed recently and she is here for evaluation and discussion of her scan results.   MEDICAL HISTORY: Past Medical History  Diagnosis Date  . Lung nodule   . Hyperlipidemia   . Osteoporosis   . Lipoma   . Hypertension   . Allergic rhinitis   . GERD (gastroesophageal reflux disease)   . Ascending aorta dilation     up to 4.3 cm by 08/13/11 echo  . lung ca dx'd 10/22/11    surg only    ALLERGIES:  is allergic to lisinopril.  MEDICATIONS:  Current Outpatient Prescriptions  Medication Sig Dispense Refill  . amLODipine (NORVASC) 5 MG tablet Take 1 tablet by mouth Daily.    . Ascorbic Acid (VITAMIN C) 1000 MG tablet Take 1,000 mg by mouth daily.    Marland Kitchen atorvastatin (LIPITOR) 20 MG tablet Take 40 mg by mouth at bedtime.     Marland Kitchen b complex vitamins capsule Take 1 capsule by mouth daily.    . Calcium Citrate-Vitamin D 315-250 MG-UNIT TABS Take by mouth.    Christiane Ha Seed POWD Take by mouth as needed. constipation    . cholecalciferol (VITAMIN D) 1000 UNITS tablet Take 1,000 Units by mouth daily.     Marland Kitchen co-enzyme Q-10 50 MG capsule Take 50 mg by mouth daily.    . Glucosamine-Chondroit-Vit C-Mn (GLUCOSAMINE 1500 COMPLEX PO) Take 2 tablets by mouth daily.    . hydrochlorothiazide (MICROZIDE) 12.5 MG capsule Take 12.5 mg by mouth Daily.    Marland Kitchen loratadine (CLARITIN) 10 MG tablet Take 10 mg by mouth daily.    . Multiple Minerals-Vitamins (CALCIUM CITRATE PLUS/MAGNESIUM) TABS Take 1 tablet by mouth daily.    . Multiple Vitamins-Minerals (MULTIVITAL) tablet Take 1 tablet by mouth daily.    . nitroGLYCERIN (NITROSTAT) 0.4 MG SL tablet Place 0.4 mg under the tongue every 5 (five) minutes as needed. For chest pain    . omeprazole (PRILOSEC) 20 MG capsule Take 20 mg by mouth daily.    . potassium chloride SA (K-DUR,KLOR-CON) 20 MEQ tablet Take 1 tablet (20 mEq total) by mouth daily. 7 tablet 0  . Probiotic Product (PROBIOTIC DAILY PO) Take by mouth.    Marland Kitchen aspirin 81 MG tablet Take 81 mg by mouth daily.     No current facility-administered medications for this visit.    SURGICAL HISTORY:  Past Surgical History  Procedure Laterality Date  . Wrist surgery      right  . Eye surgery      lasik sx, "lazy eye sx"  . Lung surgery  10/22/11    removal lymph nodes and nodules   .  Appendectomy  1963  . Vaginal hysterectomy  1993    REVIEW OF SYSTEMS:  A comprehensive review of systems was negative.   PHYSICAL EXAMINATION: General appearance: alert, cooperative and no distress Head: Normocephalic, without obvious abnormality, atraumatic Neck: no adenopathy, no JVD, supple, symmetrical, trachea midline and thyroid not enlarged, symmetric, no tenderness/mass/nodules Lymph nodes: Cervical, supraclavicular, and axillary nodes normal. Resp: clear to auscultation bilaterally Back: symmetric, no curvature. ROM normal. No CVA tenderness. Cardio: regular rate and rhythm, S1, S2 normal, no murmur, click, rub or gallop GI: soft, non-tender; bowel sounds normal; no masses,  no organomegaly Extremities:  extremities normal, atraumatic, no cyanosis or edema  ECOG PERFORMANCE STATUS: 1 - Symptomatic but completely ambulatory  Blood pressure 128/79, pulse 101, temperature 98.3 F (36.8 C), temperature source Oral, resp. rate 18, height _0  (1.727 m), weight 176 lb 14.4 oz (80.241 kg), SpO2 97 %.  LABORATORY DATA: Lab Results  Component Value Date   WBC 7.2 06/05/2014   HGB 13.7 06/05/2014   HCT 41.3 06/05/2014   MCV 88.1 06/05/2014   PLT 279 06/05/2014      Chemistry      Component Value Date/Time   NA 136 06/05/2014 1121   NA 135 12/09/2011 1258   K 3.0* 06/05/2014 1121   K 4.1 12/09/2011 1258   CL 97* 12/07/2012 1148   CL 95* 12/09/2011 1258   CO2 29 06/05/2014 1121   CO2 31 12/09/2011 1258   BUN 10.4 06/05/2014 1121   BUN 14 12/09/2011 1258   CREATININE 0.8 06/05/2014 1121   CREATININE 0.91 12/09/2011 1258      Component Value Date/Time   CALCIUM 9.7 06/05/2014 1121   CALCIUM 9.7 12/09/2011 1258   ALKPHOS 104 06/05/2014 1121   ALKPHOS 74 12/09/2011 1258   AST 23 06/05/2014 1121   AST 18 12/09/2011 1258   ALT 23 06/05/2014 1121   ALT 17 12/09/2011 1258   BILITOT 0.66 06/05/2014 1121   BILITOT 0.9 12/09/2011 1258       RADIOGRAPHIC STUDIES: Ct Chest W Contrast  06/05/2014   CLINICAL DATA:  Lung cancer diagnosed 4/13 with right upper lobectomy. Stage IA non-small cell lung cancer. Status post right upper lobectomy with lymph node dissection 10/22/2011. Currently on observation.  EXAM: CT CHEST WITH CONTRAST  TECHNIQUE: Multidetector CT imaging of the chest was performed during intravenous contrast administration.  CONTRAST:  71m OMNIPAQUE IOHEXOL 300 MG/ML  SOLN  COMPARISON:  Clinic note of 12/06/2013.  CT 12/05/2013.  FINDINGS: Lungs/Pleura:  Status post right upper lobectomy.  No evidence of locally recurrent disease.  Left upper lobe ground-glass nodule measures 1.2 x 1.0 cm on image 19. 1.1 x 0.8 cm on the prior exam. 2.0 cm on coronal image 41 versus 1.8 cm at  the same level on the prior.  A lingular ground-glass nodule measures 6 mm on image 37 and is unchanged.  No pleural fluid.  Heart/Mediastinum: No supraclavicular adenopathy. Tortuous ascending transverse aorta. Upper normal size at 3.9 cm on coronal image 31. This is unchanged. Mild cardiomegaly. LAD coronary artery atherosclerosis. No central pulmonary embolism, on this non-dedicated study. No mediastinal or hilar adenopathy. Tiny hiatal hernia.  Upper Abdomen: Mild hepatic steatosis. Normal imaged portions of the spleen, adrenal glands, kidneys  Bones/Musculoskeletal:  No acute osseous abnormality.  IMPRESSION: 1. Status post right upper lobectomy, without locally recurrent disease or nodal metastasis. 2. Left upper lobe ground-glass nodule which is felt to be minimally enlarged. Suspicious for a metachronous low  grade adenocarcinoma. A smaller lingular ground-glass nodule is similar. 3. Tortuosity and borderline ectasia of the ascending aorta, similar. 4. Coronary artery atherosclerosis. 5. Mild hepatic steatosis.   Electronically Signed   By: Abigail Miyamoto M.D.   On: 06/05/2014 14:35   ASSESSMENT AND PLAN:  This is a very pleasant 68 years old white female with history of stage IA non-small cell lung cancer status post right upper lobectomy with lymph node dissection has been observation with no evidence for disease recurrence.  The recent CT scan of the chest showed no evidence for disease recurrence but there was minimal increase in the size of the left upper lobe ground glass nodule. I discussed the scan results with the patient today. I recommended for her to continue on observation with repeat CT scan of the chest in 6 months.  She was advised to call immediately if she has any concerning symptoms in the interval.   All questions were answered. The patient knows to call the clinic with any problems, questions or concerns. We can certainly see the patient much sooner if necessary.  Disclaimer: This  note was dictated with voice recognition software. Similar sounding words can inadvertently be transcribed and may not be corrected upon review.

## 2014-06-25 NOTE — Telephone Encounter (Signed)
Gave avs & cal for June 2016.

## 2014-12-24 ENCOUNTER — Encounter (HOSPITAL_COMMUNITY): Payer: Self-pay

## 2014-12-24 ENCOUNTER — Ambulatory Visit (HOSPITAL_COMMUNITY)
Admission: RE | Admit: 2014-12-24 | Discharge: 2014-12-24 | Disposition: A | Discharge status: Home / Self Care | Payer: Medicare Other | Source: Ambulatory Visit | Attending: Internal Medicine | Admitting: Internal Medicine

## 2014-12-24 ENCOUNTER — Other Ambulatory Visit (HOSPITAL_BASED_OUTPATIENT_CLINIC_OR_DEPARTMENT_OTHER): Payer: Medicare Other

## 2014-12-24 DIAGNOSIS — Z08 Encounter for follow-up examination after completed treatment for malignant neoplasm: Secondary | ICD-10-CM | POA: Insufficient documentation

## 2014-12-24 DIAGNOSIS — C3491 Malignant neoplasm of unspecified part of right bronchus or lung: Secondary | ICD-10-CM | POA: Insufficient documentation

## 2014-12-24 DIAGNOSIS — Z902 Acquired absence of lung [part of]: Secondary | ICD-10-CM | POA: Insufficient documentation

## 2014-12-24 DIAGNOSIS — K760 Fatty (change of) liver, not elsewhere classified: Secondary | ICD-10-CM | POA: Diagnosis not present

## 2014-12-24 DIAGNOSIS — Z85118 Personal history of other malignant neoplasm of bronchus and lung: Secondary | ICD-10-CM | POA: Diagnosis not present

## 2014-12-24 DIAGNOSIS — R911 Solitary pulmonary nodule: Secondary | ICD-10-CM | POA: Insufficient documentation

## 2014-12-24 LAB — COMPREHENSIVE METABOLIC PANEL (CC13)
ALT: 21 U/L (ref 0–55)
ANION GAP: 13 meq/L — AB (ref 3–11)
AST: 21 U/L (ref 5–34)
Albumin: 4.1 g/dL (ref 3.5–5.0)
Alkaline Phosphatase: 100 U/L (ref 40–150)
BILIRUBIN TOTAL: 0.8 mg/dL (ref 0.20–1.20)
BUN: 12.3 mg/dL (ref 7.0–26.0)
CO2: 27 mEq/L (ref 22–29)
Calcium: 9.8 mg/dL (ref 8.4–10.4)
Chloride: 98 mEq/L (ref 98–109)
Creatinine: 0.9 mg/dL (ref 0.6–1.1)
EGFR: 65 mL/min/{1.73_m2} — ABNORMAL LOW (ref >90)
Glucose: 103 mg/dl (ref 70–140)
Potassium: 3.4 mEq/L — ABNORMAL LOW (ref 3.5–5.1)
SODIUM: 137 meq/L (ref 136–145)
TOTAL PROTEIN: 7.5 g/dL (ref 6.4–8.3)

## 2014-12-24 LAB — CBC WITH DIFFERENTIAL/PLATELET
BASO%: 1.1 % (ref 0.0–2.0)
BASOS ABS: 0.1 10*3/uL (ref 0.0–0.1)
EOS%: 1.4 % (ref 0.0–7.0)
Eosinophils Absolute: 0.1 10*3/uL (ref 0.0–0.5)
HCT: 42 % (ref 34.8–46.6)
HEMOGLOBIN: 14.3 g/dL (ref 11.6–15.9)
LYMPH%: 29.7 % (ref 14.0–49.7)
MCH: 29.3 pg (ref 25.1–34.0)
MCHC: 34 g/dL (ref 31.5–36.0)
MCV: 86.3 fL (ref 79.5–101.0)
MONO#: 0.6 10*3/uL (ref 0.1–0.9)
MONO%: 8.2 % (ref 0.0–14.0)
NEUT#: 4.3 10*3/uL (ref 1.5–6.5)
NEUT%: 59.6 % (ref 38.4–76.8)
Platelets: 316 10*3/uL (ref 145–400)
RBC: 4.86 10*6/uL (ref 3.70–5.45)
RDW: 13.7 % (ref 11.2–14.5)
WBC: 7.2 10*3/uL (ref 3.9–10.3)
lymph#: 2.2 10*3/uL (ref 0.9–3.3)

## 2014-12-24 MED ORDER — IOHEXOL 300 MG/ML  SOLN
100.0000 mL | Freq: Once | INTRAMUSCULAR | Status: AC | PRN
  Administered 2014-12-24: 80 mL via INTRAVENOUS

## 2014-12-25 ENCOUNTER — Telehealth: Payer: Self-pay | Admitting: Internal Medicine

## 2014-12-25 ENCOUNTER — Encounter: Payer: Self-pay | Admitting: Internal Medicine

## 2014-12-25 ENCOUNTER — Ambulatory Visit (HOSPITAL_BASED_OUTPATIENT_CLINIC_OR_DEPARTMENT_OTHER): Payer: Medicare Other | Admitting: Internal Medicine

## 2014-12-25 VITALS — BP 140/79 | HR 80 | Temp 97.6°F | Resp 19 | Ht 68.0 in | Wt 174.0 lb

## 2014-12-25 DIAGNOSIS — C3411 Malignant neoplasm of upper lobe, right bronchus or lung: Secondary | ICD-10-CM

## 2014-12-25 DIAGNOSIS — Z85118 Personal history of other malignant neoplasm of bronchus and lung: Secondary | ICD-10-CM | POA: Diagnosis not present

## 2014-12-25 DIAGNOSIS — R911 Solitary pulmonary nodule: Secondary | ICD-10-CM | POA: Diagnosis not present

## 2014-12-25 NOTE — Progress Notes (Signed)
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   PRIOR THERAPY: Status post right upper lobectomy with lymph node dissection on 10/22/2011.   CURRENT THERAPY: Observation.   INTERVAL HISTORY:  Tiffany Mcneil 69 y.o. female returns to the clinic today for routine six-month followup visit accompanied by her son. The patient is feeling fine today with no specific complaints except occasional dry cough. She denied having any significant weight loss or night sweats. She has no chest pain, shortness of breath or hemoptysis. The patient had repeat CT scan of the chest performed recently and she is here for evaluation and discussion of her scan results.   MEDICAL HISTORY: Past Medical History  Diagnosis Date  . Lung nodule   . Hyperlipidemia   . Osteoporosis   . Lipoma   . Hypertension   . Allergic rhinitis   . GERD (gastroesophageal reflux disease)   . Ascending aorta dilation     up to 4.3 cm by 08/13/11 echo  . lung ca dx'd 10/22/11    surg only    ALLERGIES:  is allergic to lisinopril.  MEDICATIONS:  Current Outpatient Prescriptions  Medication Sig Dispense Refill  . amLODipine (NORVASC) 5 MG tablet Take 1 tablet by mouth Daily.    . Ascorbic Acid (VITAMIN C) 1000 MG tablet Take 1,000 mg by mouth daily.    Marland Kitchen aspirin 81 MG tablet Take 81 mg by mouth daily.    Marland Kitchen atorvastatin (LIPITOR) 20 MG tablet Take 40 mg by mouth at bedtime.     . Calcium Citrate-Vitamin D 315-250 MG-UNIT TABS Take by mouth.    Christiane Ha Seed POWD Take by mouth as needed. constipation    . cholecalciferol (VITAMIN D) 1000 UNITS tablet Take 1,000 Units by mouth daily.    Marland Kitchen  co-enzyme Q-10 50 MG capsule Take 50 mg by mouth daily.    . Glucosamine-Chondroit-Vit C-Mn (GLUCOSAMINE 1500 COMPLEX PO) Take 2 tablets by mouth daily.    . hydrochlorothiazide (MICROZIDE) 12.5 MG capsule Take 12.5 mg by mouth Daily.    Marland Kitchen loratadine (CLARITIN) 10 MG tablet Take 10 mg by mouth daily.    . Multiple Vitamins-Minerals (MULTIVITAL) tablet Take 1 tablet by mouth daily.    Marland Kitchen omeprazole (PRILOSEC) 20 MG capsule Take 20 mg by mouth daily.    . Probiotic Product (PROBIOTIC DAILY PO) Take by mouth.    . nitroGLYCERIN (NITROSTAT) 0.4 MG SL tablet Place 0.4 mg under the tongue every 5 (five) minutes as needed. For chest pain     No current facility-administered medications for this visit.    SURGICAL HISTORY:  Past Surgical History  Procedure Laterality Date  . Wrist surgery      right  . Eye surgery      lasik sx, "lazy eye sx"  . Lung surgery  10/22/11    removal lymph nodes and nodules   . Appendectomy  1963  . Vaginal hysterectomy  1993    REVIEW OF SYSTEMS:  A comprehensive review of systems was negative.   PHYSICAL EXAMINATION: General appearance: alert, cooperative and no distress Head: Normocephalic, without obvious abnormality, atraumatic Neck: no adenopathy, no JVD, supple, symmetrical, trachea  midline and thyroid not enlarged, symmetric, no tenderness/mass/nodules Lymph nodes: Cervical, supraclavicular, and axillary nodes normal. Resp: clear to auscultation bilaterally Back: symmetric, no curvature. ROM normal. No CVA tenderness. Cardio: regular rate and rhythm, S1, S2 normal, no murmur, click, rub or gallop GI: soft, non-tender; bowel sounds normal; no masses,  no organomegaly Extremities: extremities normal, atraumatic, no cyanosis or edema  ECOG PERFORMANCE STATUS: 1 - Symptomatic but completely ambulatory  Blood pressure 140/79, pulse 80, temperature 97.6 F (36.4 C), temperature source Oral, resp. rate 19, height 5' 8"  (1.727 m), weight 174 lb (78.926 kg),  SpO2 99 %.  LABORATORY DATA: Lab Results  Component Value Date   WBC 7.2 12/24/2014   HGB 14.3 12/24/2014   HCT 42.0 12/24/2014   MCV 86.3 12/24/2014   PLT 316 12/24/2014      Chemistry      Component Value Date/Time   NA 137 12/24/2014 1212   NA 135 12/09/2011 1258   K 3.4* 12/24/2014 1212   K 4.1 12/09/2011 1258   CL 97* 12/07/2012 1148   CL 95* 12/09/2011 1258   CO2 27 12/24/2014 1212   CO2 31 12/09/2011 1258   BUN 12.3 12/24/2014 1212   BUN 14 12/09/2011 1258   CREATININE 0.9 12/24/2014 1212   CREATININE 0.91 12/09/2011 1258      Component Value Date/Time   CALCIUM 9.8 12/24/2014 1212   CALCIUM 9.7 12/09/2011 1258   ALKPHOS 100 12/24/2014 1212   ALKPHOS 74 12/09/2011 1258   AST 21 12/24/2014 1212   AST 18 12/09/2011 1258   ALT 21 12/24/2014 1212   ALT 17 12/09/2011 1258   BILITOT 0.80 12/24/2014 1212   BILITOT 0.9 12/09/2011 1258       RADIOGRAPHIC STUDIES: Ct Chest W Contrast  12/24/2014   CLINICAL DATA:  Lung cancer diagnosed 4/13 with right upper lobectomy.  EXAM: CT CHEST WITH CONTRAST  TECHNIQUE: Multidetector CT imaging of the chest was performed during intravenous contrast administration.  CONTRAST:  72m OMNIPAQUE IOHEXOL 300 MG/ML  SOLN  COMPARISON:  06/05/2014  FINDINGS: Mediastinum/Nodes: No supraclavicular adenopathy. Re- demonstration of borderline ascending aortic dilatation at 3.9 cm on coronal reformatted images. Mild cardiomegaly with coronary artery atherosclerosis within the distal left main and likely the proximal LAD. No central pulmonary embolism, on this non-dedicated study. No mediastinal or hilar adenopathy.  Lungs/Pleura: No pleural fluid.  Right upper lobectomy.  Left upper lobe ground-glass nodule measures 11 x 12 mm on image 15 versus 10 x 12 mm on the prior exam (when remeasured). Transverse image 15 today. On coronal image 37, 2.0 cm today versus similar on the prior. 6 mm lingular ground-glass nodule is unchanged, including on image 33.   Left base scarring or atelectasis.  Upper abdomen: Mild hepatic steatosis. Normal imaged portions of the spleen, pancreas, adrenal glands, kidneys.  Musculoskeletal: No acute osseous abnormality.  IMPRESSION: 1. Status post right upper lobectomy, without locally recurrent or metastatic disease. 2. Similar to slight enlargement of a left upper lobe ground-glass nodule. This remains suspicious for metachronous low-grade adenocarcinoma. A smaller lingular nodule remains unchanged. 3. Similar borderline ectasia of the ascending aorta. 4.  Atherosclerosis, including within the coronary arteries. 5. Mild hepatic steatosis.   Electronically Signed   By: KAbigail MiyamotoM.D.   On: 12/24/2014 15:00   ASSESSMENT AND PLAN:  This is a very pleasant 69years old white female with history of stage IA non-small cell lung cancer status post right upper lobectomy with lymph node  dissection has been observation with no evidence for disease recurrence.  The recent CT scan of the chest showed no evidence for disease recurrence but again there was minimal increase in the size of the left upper lobe ground glass nodule. I discussed the scan results with the patient and her son and showed them the images today. I recommended for her to continue on observation with repeat CT scan of the chest in 6 months.  She was advised to call immediately if she has any concerning symptoms in the interval.   All questions were answered. The patient knows to call the clinic with any problems, questions or concerns. We can certainly see the patient much sooner if necessary.  Disclaimer: This note was dictated with voice recognition software. Similar sounding words can inadvertently be transcribed and may not be corrected upon review.

## 2014-12-25 NOTE — Telephone Encounter (Signed)
Gave patient avs report and appointments for December. Central radiology scheduling will call patient with ct - patient aware.

## 2015-06-18 ENCOUNTER — Ambulatory Visit (HOSPITAL_COMMUNITY)
Admission: RE | Admit: 2015-06-18 | Discharge: 2015-06-18 | Disposition: A | Discharge status: Home / Self Care | Payer: Medicare Other | Source: Ambulatory Visit | Attending: Internal Medicine | Admitting: Internal Medicine

## 2015-06-18 ENCOUNTER — Other Ambulatory Visit (HOSPITAL_BASED_OUTPATIENT_CLINIC_OR_DEPARTMENT_OTHER): Payer: Medicare Other

## 2015-06-18 ENCOUNTER — Encounter (HOSPITAL_COMMUNITY): Payer: Self-pay

## 2015-06-18 DIAGNOSIS — Z902 Acquired absence of lung [part of]: Secondary | ICD-10-CM | POA: Diagnosis not present

## 2015-06-18 DIAGNOSIS — Z85118 Personal history of other malignant neoplasm of bronchus and lung: Secondary | ICD-10-CM | POA: Diagnosis not present

## 2015-06-18 DIAGNOSIS — C3411 Malignant neoplasm of upper lobe, right bronchus or lung: Secondary | ICD-10-CM | POA: Diagnosis not present

## 2015-06-18 DIAGNOSIS — R918 Other nonspecific abnormal finding of lung field: Secondary | ICD-10-CM | POA: Insufficient documentation

## 2015-06-18 DIAGNOSIS — I77810 Thoracic aortic ectasia: Secondary | ICD-10-CM | POA: Diagnosis not present

## 2015-06-18 LAB — CBC WITH DIFFERENTIAL/PLATELET
BASO%: 1.1 % (ref 0.0–2.0)
Basophils Absolute: 0.1 10*3/uL (ref 0.0–0.1)
EOS ABS: 0.1 10*3/uL (ref 0.0–0.5)
EOS%: 1.8 % (ref 0.0–7.0)
HCT: 43.6 % (ref 34.8–46.6)
HGB: 14.7 g/dL (ref 11.6–15.9)
LYMPH%: 27.5 % (ref 14.0–49.7)
MCH: 29.4 pg (ref 25.1–34.0)
MCHC: 33.7 g/dL (ref 31.5–36.0)
MCV: 87.4 fL (ref 79.5–101.0)
MONO#: 0.7 10*3/uL (ref 0.1–0.9)
MONO%: 9.2 % (ref 0.0–14.0)
NEUT%: 60.4 % (ref 38.4–76.8)
NEUTROS ABS: 4.7 10*3/uL (ref 1.5–6.5)
Platelets: 308 10*3/uL (ref 145–400)
RBC: 4.99 10*6/uL (ref 3.70–5.45)
RDW: 13.2 % (ref 11.2–14.5)
WBC: 7.8 10*3/uL (ref 3.9–10.3)
lymph#: 2.2 10*3/uL (ref 0.9–3.3)

## 2015-06-18 LAB — COMPREHENSIVE METABOLIC PANEL
ALT: 22 U/L (ref 0–55)
AST: 22 U/L (ref 5–34)
Albumin: 4.2 g/dL (ref 3.5–5.0)
Alkaline Phosphatase: 116 U/L (ref 40–150)
Anion Gap: 11 mEq/L (ref 3–11)
BUN: 12.5 mg/dL (ref 7.0–26.0)
CHLORIDE: 95 meq/L — AB (ref 98–109)
CO2: 30 meq/L — AB (ref 22–29)
Calcium: 10.2 mg/dL (ref 8.4–10.4)
Creatinine: 0.9 mg/dL (ref 0.6–1.1)
EGFR: 69 mL/min/{1.73_m2} — AB (ref >90)
GLUCOSE: 99 mg/dL (ref 70–140)
Potassium: 3.9 mEq/L (ref 3.5–5.1)
SODIUM: 136 meq/L (ref 136–145)
TOTAL PROTEIN: 7.9 g/dL (ref 6.4–8.3)
Total Bilirubin: 0.71 mg/dL (ref 0.20–1.20)

## 2015-06-18 MED ORDER — IOHEXOL 300 MG/ML  SOLN
75.0000 mL | Freq: Once | INTRAMUSCULAR | Status: AC | PRN
Start: 2015-06-18 — End: 2015-06-18
  Administered 2015-06-18: 75 mL via INTRAVENOUS

## 2015-06-19 ENCOUNTER — Telehealth: Payer: Self-pay | Admitting: Internal Medicine

## 2015-06-19 ENCOUNTER — Telehealth: Payer: Self-pay | Admitting: Medical Oncology

## 2015-06-19 ENCOUNTER — Encounter: Payer: Self-pay | Admitting: Internal Medicine

## 2015-06-19 ENCOUNTER — Ambulatory Visit (HOSPITAL_BASED_OUTPATIENT_CLINIC_OR_DEPARTMENT_OTHER): Payer: Medicare Other | Admitting: Internal Medicine

## 2015-06-19 VITALS — BP 124/67 | HR 83 | Temp 97.6°F | Resp 18 | Ht 68.0 in | Wt 178.3 lb

## 2015-06-19 DIAGNOSIS — Z85118 Personal history of other malignant neoplasm of bronchus and lung: Secondary | ICD-10-CM

## 2015-06-19 DIAGNOSIS — C3411 Malignant neoplasm of upper lobe, right bronchus or lung: Secondary | ICD-10-CM

## 2015-06-19 NOTE — Progress Notes (Signed)
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   PRIOR THERAPY: Status post right upper lobectomy with lymph node dissection on 10/22/2011.   CURRENT THERAPY: Observation.   INTERVAL HISTORY:  Tiffany Mcneil 69 y.o. female returns to the clinic today for routine six-month followup visit.  The patient is feeling fine today with no specific complaints except occasional dry cough. She tried several over-the-counter cough medication with no significant improvement. She was seen Dr. Melvyn Novas in the past but she is planning to see a local pulmonologist in [facility] close to home. She denied having any significant weight loss or night sweats. She has no chest pain, shortness of breath or hemoptysis. The patient had repeat CT scan of the chest performed recently and she is here for evaluation and discussion of her scan results.   MEDICAL HISTORY: Past Medical History  Diagnosis Date  . Lung nodule   . Hyperlipidemia   . Osteoporosis   . Lipoma   . Hypertension   . Allergic rhinitis   . GERD (gastroesophageal reflux disease)   . Ascending aorta dilation (HCC)     up to 4.3 cm by 08/13/11 echo  . lung ca dx'd 10/22/11    surg only    ALLERGIES:  is allergic to lisinopril.  MEDICATIONS:  Current Outpatient Prescriptions  Medication Sig Dispense Refill  . amLODipine (NORVASC) 5 MG tablet Take 1 tablet by mouth Daily.    . Ascorbic Acid (VITAMIN C) 1000 MG tablet Take 1,000 mg by mouth daily.    Marland Kitchen aspirin 81 MG tablet Take 81 mg by mouth daily.    Marland Kitchen atorvastatin (LIPITOR) 20 MG tablet Take 40 mg by mouth at bedtime.     . Calcium  Citrate-Vitamin D 315-250 MG-UNIT TABS Take by mouth.    Christiane Ha Seed POWD Take by mouth as needed. constipation    . cholecalciferol (VITAMIN D) 1000 UNITS tablet Take 1,000 Units by mouth daily.    Marland Kitchen co-enzyme Q-10 50 MG capsule Take 50 mg by mouth daily.    . Glucosamine-Chondroit-Vit C-Mn (GLUCOSAMINE 1500 COMPLEX PO) Take 2 tablets by mouth daily.    . hydrochlorothiazide (MICROZIDE) 12.5 MG capsule Take 12.5 mg by mouth Daily.    Marland Kitchen loratadine (CLARITIN) 10 MG tablet Take 10 mg by mouth daily.    . Multiple Vitamins-Minerals (MULTIVITAL) tablet Take 1 tablet by mouth daily.    . nitroGLYCERIN (NITROSTAT) 0.4 MG SL tablet Place 0.4 mg under the tongue every 5 (five) minutes as needed. For chest pain    . omeprazole (PRILOSEC) 20 MG capsule Take 20 mg by mouth daily.    . Probiotic Product (PROBIOTIC DAILY PO) Take by mouth.     No current facility-administered medications for this visit.    SURGICAL HISTORY:  Past Surgical History  Procedure Laterality Date  . Wrist surgery      right  . Eye surgery      lasik sx, "lazy eye sx"  . Lung surgery  10/22/11    removal lymph nodes and nodules   . Appendectomy  1963  . Vaginal hysterectomy  1993    REVIEW OF SYSTEMS:  A comprehensive review of systems was negative except for: Respiratory: positive for cough   PHYSICAL EXAMINATION: General appearance: alert, cooperative and no distress Head: Normocephalic, without obvious abnormality, atraumatic Neck: no adenopathy, no JVD, supple, symmetrical, trachea midline and thyroid not enlarged, symmetric, no tenderness/mass/nodules Lymph nodes: Cervical, supraclavicular, and axillary nodes normal. Resp: clear to auscultation bilaterally Back: symmetric, no curvature. ROM normal. No CVA tenderness. Cardio: regular rate and rhythm, S1, S2 normal, no murmur, click, rub or gallop GI: soft, non-tender; bowel sounds normal; no masses,  no organomegaly Extremities: extremities normal, atraumatic, no  cyanosis or edema  ECOG PERFORMANCE STATUS: 1 - Symptomatic but completely ambulatory  Blood pressure 124/67, pulse 83, temperature 97.6 F (36.4 C), temperature source Oral, resp. rate 18, height 5' 8"  (1.727 m), weight 178 lb 4.8 oz (80.876 kg), SpO2 100 %.  LABORATORY DATA: Lab Results  Component Value Date   WBC 7.8 06/18/2015   HGB 14.7 06/18/2015   HCT 43.6 06/18/2015   MCV 87.4 06/18/2015   PLT 308 06/18/2015      Chemistry      Component Value Date/Time   NA 136 06/18/2015 1159   NA 135 12/09/2011 1258   K 3.9 06/18/2015 1159   K 4.1 12/09/2011 1258   CL 97* 12/07/2012 1148   CL 95* 12/09/2011 1258   CO2 30* 06/18/2015 1159   CO2 31 12/09/2011 1258   BUN 12.5 06/18/2015 1159   BUN 14 12/09/2011 1258   CREATININE 0.9 06/18/2015 1159   CREATININE 0.91 12/09/2011 1258      Component Value Date/Time   CALCIUM 10.2 06/18/2015 1159   CALCIUM 9.7 12/09/2011 1258   ALKPHOS 116 06/18/2015 1159   ALKPHOS 74 12/09/2011 1258   AST 22 06/18/2015 1159   AST 18 12/09/2011 1258   ALT 22 06/18/2015 1159   ALT 17 12/09/2011 1258   BILITOT 0.71 06/18/2015 1159   BILITOT 0.9 12/09/2011 1258       RADIOGRAPHIC STUDIES: Ct Chest W Contrast  06/18/2015  CLINICAL DATA:  Right upper lobe lungs adenocarcinoma status post right upper lobectomy on 10/22/2011, presenting for restaging. Cough. EXAM: CT CHEST WITH CONTRAST TECHNIQUE: Multidetector CT imaging of the chest was performed during intravenous contrast administration. CONTRAST:  79m OMNIPAQUE IOHEXOL 300 MG/ML  SOLN COMPARISON:  12/24/2014 chest CT. FINDINGS: Mediastinum/Nodes: Normal heart size. No pericardial fluid/thickening. Left main and left anterior descending coronary atherosclerosis. 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 Normal caliber pulmonary arteries. No central pulmonary emboli. Normal visualized thyroid. Normal esophagus. No pathologically enlarged axillary, mediastinal or hilar  lymph nodes. Lungs/Pleura: No pneumothorax. No pleural effusion. Status post right upper lobectomy. Right lower lobe 4 mm ground-glass pulmonary nodule (series 5/ image 25) is not appreciably changed back to at least 12/07/2012. Posterior left upper lobe 3 mm solid pulmonary nodule (series 5/ image 16) is stable since at least 11/29/2012, in keeping with a benign etiology. Left upper lobe 1.2 x 1.2 cm ground-glass pulmonary nodule (series 5/image 16) measured 1.2 x 1.1 cm on 12/24/2014, not appreciably changed, however mildly increased from 1.0 x 0.8 cm on the more remote 09/29/2011 CT. Lingular 0.9 x 0.7 cm ground-glass pulmonary nodule (series 5/ image 34) is not appreciably changed back to at least 11/29/2012 using similar measurement technique. Stable minimal scarring in the anterior basilar left lower lobe. No acute consolidative airspace disease, new significant pulmonary nodules or lung masses. Upper abdomen: Unremarkable. Musculoskeletal: No aggressive appearing focal osseous lesions. Tiny  sclerotic focus in the manubrium is stable since 09/29/2011, likely a benign bone island. Minimal degenerative changes in the thoracic spine. IMPRESSION: 1. No appreciable interval change in left upper lobe 1.2 cm ground-glass pulmonary nodule, which is minimally increased in size compared to the most remote chest CT from 09/29/2011, and remains suspicious for an indolent adenocarcinoma. 2. Smaller ground-glass pulmonary nodules in the right lower lobe and lingula have been stable for over 2 years. 3. Status post right upper lobectomy, with no evidence of local tumor recurrence. 4. No evidence of metastatic disease in the chest. 5. 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 Recommend annual imaging followup by CTA or MRA. This recommendation follows 2010 ACCF/AHA/AATS/ACR/ASA/SCA/SCAI/SIR/STS/SVM Guidelines for the Diagnosis and Management of Patients with Thoracic Aortic Disease. Circulation.  2010; 121: [id number] Electronically Signed   By: Ilona Sorrel M.D.   On: 06/18/2015 16:00   ASSESSMENT AND PLAN:  This is a very pleasant 69 years old white female with history of stage IA non-small cell lung cancer status post right upper lobectomy with lymph node dissection has been observation with no evidence for disease recurrence.  The recent CT scan of the chest showed no evidence for disease recurrence but again there was minimal increase in the size of the left upper lobe ground glass nodule, suspicious for low-grade adenocarcinoma. I discussed the scan results with the patient today. I recommended for her to continue on observation with repeat CT scan of the chest in 6 months.  She was advised to call immediately if she has any concerning symptoms in the interval.   All questions were answered. The patient knows to call the clinic with any problems, questions or concerns. We can certainly see the patient much sooner if necessary.  Disclaimer: This note was dictated with voice recognition software. Similar sounding words can inadvertently be transcribed and may not be corrected upon review.

## 2015-06-19 NOTE — Telephone Encounter (Signed)
Did pt leave before being seen? I left a message for her to call back

## 2015-06-19 NOTE — Telephone Encounter (Signed)
Gave and pritned appt sched and avs for pt for June 2017

## 2015-09-06 ENCOUNTER — Telehealth: Payer: Self-pay | Admitting: Internal Medicine

## 2015-09-06 NOTE — Telephone Encounter (Signed)
FAXED PT Tiffany Mcneil. REF #[id number]

## 2015-10-09 ENCOUNTER — Telehealth: Payer: Self-pay | Admitting: Internal Medicine

## 2015-10-09 NOTE — Telephone Encounter (Signed)
returned call and s.w. pt and cx all appts...pts insurance changed her providers

## 2015-12-18 ENCOUNTER — Other Ambulatory Visit: Payer: Medicare Other

## 2015-12-19 ENCOUNTER — Ambulatory Visit: Payer: Medicare Other | Admitting: Internal Medicine
